# Patient Record
Sex: Female | Born: 1960 | Race: White | Hispanic: No | Marital: Married | Smoking: Never smoker
Health system: Southern US, Community
[De-identification: ages and names within clinical notes are randomized; demographics above are authoritative.]

## PROBLEM LIST (undated history)

## (undated) DIAGNOSIS — M810 Age-related osteoporosis without current pathological fracture: Secondary | ICD-10-CM

## (undated) DIAGNOSIS — G43909 Migraine, unspecified, not intractable, without status migrainosus: Secondary | ICD-10-CM

## (undated) DIAGNOSIS — D355 Benign neoplasm of carotid body: Secondary | ICD-10-CM

## (undated) HISTORY — PX: CAROTID BODY TUMOR EXCISION: SHX5156

## (undated) HISTORY — DX: Migraine, unspecified, not intractable, without status migrainosus: G43.909

## (undated) HISTORY — DX: Benign neoplasm of carotid body: D35.5

## (undated) HISTORY — DX: Age-related osteoporosis without current pathological fracture: M81.0

---

## 2007-05-07 ENCOUNTER — Ambulatory Visit: Payer: Self-pay | Admitting: Orthopedic Surgery

## 2010-08-19 ENCOUNTER — Ambulatory Visit: Payer: Self-pay | Admitting: Family Medicine

## 2013-10-06 ENCOUNTER — Ambulatory Visit: Payer: Self-pay | Admitting: Gastroenterology

## 2013-10-06 HISTORY — PX: COLONOSCOPY: SHX174

## 2014-09-14 ENCOUNTER — Encounter: Payer: Self-pay | Admitting: Family Medicine

## 2014-10-02 DIAGNOSIS — M810 Age-related osteoporosis without current pathological fracture: Secondary | ICD-10-CM | POA: Insufficient documentation

## 2014-10-02 DIAGNOSIS — G43909 Migraine, unspecified, not intractable, without status migrainosus: Secondary | ICD-10-CM | POA: Insufficient documentation

## 2014-10-02 DIAGNOSIS — D355 Benign neoplasm of carotid body: Secondary | ICD-10-CM | POA: Insufficient documentation

## 2014-10-14 ENCOUNTER — Encounter: Payer: Self-pay | Admitting: Family Medicine

## 2014-10-28 ENCOUNTER — Encounter: Payer: Self-pay | Admitting: Family Medicine

## 2014-11-12 ENCOUNTER — Encounter: Payer: Self-pay | Admitting: Family Medicine

## 2014-11-12 ENCOUNTER — Ambulatory Visit (INDEPENDENT_AMBULATORY_CARE_PROVIDER_SITE_OTHER): Payer: BC Managed Care – PPO | Admitting: Family Medicine

## 2014-11-12 VITALS — BP 113/71 | HR 75 | Temp 98.5°F | Ht 66.1 in | Wt 131.0 lb

## 2014-11-12 DIAGNOSIS — N889 Noninflammatory disorder of cervix uteri, unspecified: Secondary | ICD-10-CM

## 2014-11-12 DIAGNOSIS — M810 Age-related osteoporosis without current pathological fracture: Secondary | ICD-10-CM

## 2014-11-12 DIAGNOSIS — Z124 Encounter for screening for malignant neoplasm of cervix: Secondary | ICD-10-CM

## 2014-11-12 DIAGNOSIS — Z23 Encounter for immunization: Secondary | ICD-10-CM | POA: Diagnosis not present

## 2014-11-12 DIAGNOSIS — Z1239 Encounter for other screening for malignant neoplasm of breast: Secondary | ICD-10-CM

## 2014-11-12 DIAGNOSIS — Z0000 Encounter for general adult medical examination without abnormal findings: Secondary | ICD-10-CM

## 2014-11-12 NOTE — Assessment & Plan Note (Signed)
Pap smear today; request records from gyn; refer back for evaluation since I don't have any of her records and cervix still appears abnormal

## 2014-11-12 NOTE — Assessment & Plan Note (Addendum)
DEXA scan 2012, T-score -2.89 in hip; ordered another DEXA today; three servings of calcium a day; check vit D level; weight bearing activity encouraged

## 2014-11-12 NOTE — Progress Notes (Signed)
Patient ID: Caroline Harmon, female   DOB: [birth year], 54 y.o.   MRN: [id number]   Subjective:   Caroline Harmon is a 54 y.o. female here for a complete physical exam  Interim issues since last visit: no medical excitement  Grade A and B USPSTF recommendations Alcohol: not an issue Tobacco: no Depression screen PHQ 2/9 11/12/2014  Decreased Interest 0  Down, Depressed, Hopeless 0  PHQ - 2 Score 0  HTN: great control Obesity: n/a HIV, Hep B, Hep C: politley declined No abuse BRCA: n/a Cerv: last pap smear in July 2015; neg and HPV neg Colonoscopy: Sept 2015, next 10 years Mammogram; overdue Lung cancer: n/a Osteoporosis: no milk; does get green leafies; does eat tofu; nonsmoker AAA: n/a LMP 2 years ago Aspirin: not taking Diet: good eater; occasional red meat, mainly chicken and fish Exercise: walks regularly, could do better; weight-bearing activity Skin cancer: no tanning bed, time outdoors, uses suntan lotion  Past Medical History  Diagnosis Date  . Migraine   . Osteoporosis   . 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 Carotid body tumor excision      benign  . Colonoscopy  10/06/13   Family History  Problem Relation Age of Onset  . Migraines Mother   . Heart attack Father   . Cancer Father     skin  . Hypertension Father    Social History  Substance Use Topics  . Smoking status: Never Smoker   . Smokeless tobacco: Never Used  . Alcohol Use: Yes     Comment: occassional   Review of Systems  Constitutional: Negative for unexpected weight change.  HENT: Positive for nosebleeds (occasionally from dry air, allergies). Negative for dental problem and hearing loss.        Itching and pain in left ear  Eyes: Negative for visual disturbance.  Respiratory: Negative for shortness of breath and wheezing.   Cardiovascular: Negative for chest pain, palpitations and leg swelling.   Gastrointestinal: Negative for abdominal pain and blood in stool.  Endocrine: Negative for polydipsia and polyuria.  Genitourinary: Negative for hematuria, vaginal bleeding and vaginal discharge.  Musculoskeletal: Negative for joint swelling and arthralgias.       Some discomfort in right elbow if bent for 20 minutes, hurts to push on it; going on for a year  Skin:       No worrisome moles  Allergic/Immunologic: Positive for environmental allergies (OTC stuff).  Neurological: Negative for tremors.  Hematological: Negative for adenopathy. Does not bruise/bleed easily.  Psychiatric/Behavioral: Negative for dysphoric mood.    Objective:   Filed Vitals:   11/12/14 1505  BP: 113/71  Pulse: 75  Temp: 98.5 F (36.9 C)  Height: 5' 6.1" (1.679 m)  Weight: 131 lb (59.421 kg)  SpO2: 98%   Body mass index is 21.08 kg/(m^2). Wt Readings from Last 3 Encounters:  11/12/14 131 lb (59.421 kg)  09/10/13 137 lb (62.143 kg)   Physical Exam  Constitutional: She appears well-developed and well-nourished.  HENT:  Head: Normocephalic and atraumatic.  Eyes: Conjunctivae and EOM are normal. Right eye exhibits no hordeolum. Left eye exhibits no hordeolum. No scleral icterus.  Neck: Carotid bruit is not present. No thyromegaly present.  Cardiovascular: Normal rate, regular rhythm, S1 normal, S2 normal and normal heart sounds.   No extrasystoles are present.  Pulmonary/Chest: Effort normal and breath sounds normal. No respiratory distress. Right breast  exhibits no inverted nipple, no mass, no nipple discharge, no skin change and no tenderness. Left breast exhibits no inverted nipple, no mass, no nipple discharge, no skin change and no tenderness. Breasts are symmetrical.  Abdominal: Soft. Normal appearance and bowel sounds are normal. She exhibits no distension, no abdominal bruit, no pulsatile midline mass and no mass. There is no hepatosplenomegaly. There is no tenderness. No hernia.  Genitourinary:  Uterus normal. Pelvic exam was performed with patient prone. There is no rash or lesion on the right labia. There is no rash or lesion on the left labia. Cervix exhibits no motion tenderness. Right adnexum displays no mass, no tenderness and no fullness. Left adnexum displays no mass, no tenderness and no fullness.  Cervix shows either moderate transition zone or erythematous zone surrounding os  Musculoskeletal: Normal range of motion. She exhibits no edema.  Lymphadenopathy:       Head (right side): No submandibular adenopathy present.       Head (left side): No submandibular adenopathy present.    She has no cervical adenopathy.    She has no axillary adenopathy.  Neurological: She is alert. She displays no tremor. No cranial nerve deficit. She exhibits normal muscle tone. Gait normal.  Skin: Skin is warm and dry. No bruising and no ecchymosis noted. No cyanosis. No pallor.  Psychiatric: Her speech is normal and behavior is normal. Thought content normal. Her mood appears not anxious. She does not exhibit a depressed mood.    Assessment/Plan:   Problem List Items Addressed This Visit      Musculoskeletal and Integument   Osteoporosis    DEXA scan 2012, T-score -2.89 in hip; ordered another DEXA today; three servings of calcium a day; check vit D level; weight bearing activity encouraged      Relevant Orders   DG Bone Density   Vit D  25 hydroxy (rtn osteoporosis monitoring) (Completed)     Genitourinary   Abnormal appearance of cervix    Refer back to gyn      Relevant Orders   Ambulatory referral to Gynecology   Pap liquid-based and HPV (high risk) (Completed)     Other   Preventative health care - Primary    Healthy living encouraged; grade A and B recommendations USPSTF reviewed; see AVS      Relevant Orders   CBC with Differential/Platelet (Completed)   Comprehensive metabolic panel (Completed)   Lipid Panel w/o Chol/HDL Ratio (Completed)   Screening for cervical  cancer    Pap smear today; request records from gyn; refer back for evaluation since I don't have any of her records and cervix still appears abnormal      Relevant Orders   Pap liquid-based and HPV (high risk) (Completed)    Other Visit Diagnoses    Breast cancer screening        Relevant Orders    MM DIGITAL SCREENING BILATERAL    Immunization due        Relevant Orders    Tdap vaccine greater than or equal to 7yo IM (Completed)        No orders of the defined types were placed in this encounter.   Orders Placed This Encounter  Procedures  . MM DIGITAL SCREENING BILATERAL    Standing Status: Future     Number of Occurrences:      Standing Expiration Date: 01/12/2016    Order Specific Question:  Reason for Exam (SYMPTOM  OR DIAGNOSIS REQUIRED)    Answer:  screening mammogram    Order Specific Question:  Is the patient pregnant?    Answer:  No    Order Specific Question:  Preferred imaging location?    Answer:  [facility]  . DG Bone Density    Order Specific Question:  Reason for Exam (SYMPTOM  OR DIAGNOSIS REQUIRED)    Answer:  osteoporosis hip on DEXA 2012    Order Specific Question:  Preferred [facility] location?    [facility]    Order Specific Question:  Is the patient pregnant?    Answer:  No  . Tdap vaccine greater than or equal to 7yo IM  . CBC with Differential/Platelet  . Comprehensive metabolic panel    Order Specific Question:  Has the patient fasted?    Answer:  Yes  . Lipid Panel w/o Chol/HDL Ratio    Order Specific Question:  Has the patient fasted?    Answer:  Yes  . Vit D  25 hydroxy (rtn osteoporosis monitoring)  . Ambulatory referral to Gynecology    Referral Priority:  Routine    Referral Type:  Consultation    Referral Reason:  Specialty Services Required    Requested Specialty:  Gynecology    Number of Visits Requested:  1    Follow up plan: Return in about 1 year (around 11/12/2015) for physical.  An After Visit  Summary was printed and given to the patient.

## 2014-11-12 NOTE — Patient Instructions (Addendum)
GINA --> please get records from gynecologist including all pathology reports and office visits Your next colonoscopy will be due by August 2025 Please do schedule a screening mammogram Do try to get 3 servings of calcium a day Consider baby 81 mg coated aspirin daily for stroke prevention; stop immediately and seek attention for any abdominal pain, dark stools, etc. Use topical ice 15 minutes at a time 3x a day, use protective cloth between ice and skin Try turmeric as a natural anti-inflammatory (for pain and arthritis). It comes in capsules where you buy aspirin and fish oil, but also as a spice where you buy pepper and garlic powder. Consider something for phone use other than bending that elbow I put in the referral back to Dr. Marcelline Mates to check out your cervix, so my staff should call you about that appointment Return in 12+ months for your next physical  Health Maintenance Adopting a healthy lifestyle and getting preventive care can go a long way to promote health and wellness. Talk with your health care provider about what schedule of regular examinations is right for you. This is a good chance for you to check in with your provider about disease prevention and staying healthy. In between checkups, there are plenty of things you can do on your own. Experts have done a lot of research about which lifestyle changes and preventive measures are most likely to keep you healthy. Ask your health care provider for more information. WEIGHT AND DIET  Eat a healthy diet  Be sure to include plenty of vegetables, fruits, low-fat dairy products, and lean protein.  Do not eat a lot of foods high in solid fats, added sugars, or salt.  Get regular exercise. This is one of the most important things you can do for your health.  Most adults should exercise for at least 150 minutes each week. The exercise should increase your heart rate and make you sweat (moderate-intensity exercise).  Most adults should  also do strengthening exercises at least twice a week. This is in addition to the moderate-intensity exercise.  Maintain a healthy weight  Body mass index (BMI) is a measurement that can be used to identify possible weight problems. It estimates body fat based on height and weight. Your health care provider can help determine your BMI and help you achieve or maintain a healthy weight.  For females 37 years of age and older:   A BMI below 18.5 is considered underweight.  A BMI of 18.5 to 24.9 is normal.  A BMI of 25 to 29.9 is considered overweight.  A BMI of 30 and above is considered obese.  Watch levels of cholesterol and blood lipids  You should start having your blood tested for lipids and cholesterol at 56 years of age, then have this test every 5 years.  You may need to have your cholesterol levels checked more often if:  Your lipid or cholesterol levels are high.  You are older than 56 years of age.  You are at high risk for heart disease.  CANCER SCREENING   Lung Cancer  Lung cancer screening is recommended for adults 79-70 years old who are at high risk for lung cancer because of a history of smoking.  A yearly low-dose CT scan of the lungs is recommended for people who:  Currently smoke.  Have quit within the past 15 years.  Have at least a 30-pack-year history of smoking. A pack year is smoking an average of one pack of  cigarettes a day for 1 year.  Yearly screening should continue until it has been 15 years since you quit.  Yearly screening should stop if you develop a health problem that would prevent you from having lung cancer treatment.  Breast Cancer  Practice breast self-awareness. This means understanding how your breasts normally appear and feel.  It also means doing regular breast self-exams. Let your health care provider know about any changes, no matter how small.  If you are in your 20s or 30s, you should have a clinical breast exam (CBE)  by a health care provider every 1-3 years as part of a regular health exam.  If you are 25 or older, have a CBE every year. Also consider having a breast X-ray (mammogram) every year.  If you have a family history of breast cancer, talk to your health care provider about genetic screening.  If you are at high risk for breast cancer, talk to your health care provider about having an MRI and a mammogram every year.  Breast cancer gene (BRCA) assessment is recommended for women who have family members with BRCA-related cancers. BRCA-related cancers include:  Breast.  Ovarian.  Tubal.  Peritoneal cancers.  Results of the assessment will determine the need for genetic counseling and BRCA1 and BRCA2 testing. Cervical Cancer Routine pelvic examinations to screen for cervical cancer are no longer recommended for nonpregnant women who are considered low risk for cancer of the pelvic organs (ovaries, uterus, and vagina) and who do not have symptoms. A pelvic examination may be necessary if you have symptoms including those associated with pelvic infections. Ask your health care provider if a screening pelvic exam is right for you.   The Pap test is the screening test for cervical cancer for women who are considered at risk.  If you had a hysterectomy for a problem that was not cancer or a condition that could lead to cancer, then you no longer need Pap tests.  If you are older than 65 years, and you have had normal Pap tests for the past 10 years, you no longer need to have Pap tests.  If you have had past treatment for cervical cancer or a condition that could lead to cancer, you need Pap tests and screening for cancer for at least 20 years after your treatment.  If you no longer get a Pap test, assess your risk factors if they change (such as having a new sexual partner). This can affect whether you should start being screened again.  Some women have medical problems that increase their  chance of getting cervical cancer. If this is the case for you, your health care provider may recommend more frequent screening and Pap tests.  The human papillomavirus (HPV) test is another test that may be used for cervical cancer screening. The HPV test looks for the virus that can cause cell changes in the cervix. The cells collected during the Pap test can be tested for HPV.  The HPV test can be used to screen women 43 years of age and older. Getting tested for HPV can extend the interval between normal Pap tests from three to five years.  An HPV test also should be used to screen women of any age who have unclear Pap test results.  After 56 years of age, women should have HPV testing as often as Pap tests.  Colorectal Cancer  This type of cancer can be detected and often prevented.  Routine colorectal cancer screening usually begins  at 56 years of age and continues through 56 years of age.  Your health care provider may recommend screening at an earlier age if you have risk factors for colon cancer.  Your health care provider may also recommend using home test kits to check for hidden blood in the stool.  A small camera at the end of a tube can be used to examine your colon directly (sigmoidoscopy or colonoscopy). This is done to check for the earliest forms of colorectal cancer.  Routine screening usually begins at age 28.  Direct examination of the colon should be repeated every 5-10 years through 56 years of age. However, you may need to be screened more often if early forms of precancerous polyps or small growths are found. Skin Cancer  Check your skin from head to toe regularly.  Tell your health care provider about any new moles or changes in moles, especially if there is a change in a mole's shape or color.  Also tell your health care provider if you have a mole that is larger than the size of a pencil eraser.  Always use sunscreen. Apply sunscreen liberally and  repeatedly throughout the day.  Protect yourself by wearing long sleeves, pants, a wide-brimmed hat, and sunglasses whenever you are outside. HEART DISEASE, DIABETES, AND HIGH BLOOD PRESSURE   Have your blood pressure checked at least every 1-2 years. High blood pressure causes heart disease and increases the risk of stroke.  If you are between 22 years and 50 years old, ask your health care provider if you should take aspirin to prevent strokes.  Have regular diabetes screenings. This involves taking a blood sample to check your fasting blood sugar level.  If you are at a normal weight and have a low risk for diabetes, have this test once every three years after 56 years of age.  If you are overweight and have a high risk for diabetes, consider being tested at a younger age or more often. PREVENTING INFECTION  Hepatitis B  If you have a higher risk for hepatitis B, you should be screened for this virus. You are considered at high risk for hepatitis B if:  You were born in a country where hepatitis B is common. Ask your health care provider which countries are considered high risk.  Your parents were born in a high-risk country, and you have not been immunized against hepatitis B (hepatitis B vaccine).  You have HIV or AIDS.  You use needles to inject street drugs.  You live with someone who has hepatitis B.  You have had sex with someone who has hepatitis B.  You get hemodialysis treatment.  You take certain medicines for conditions, including cancer, organ transplantation, and autoimmune conditions. Hepatitis C  Blood testing is recommended for:  Everyone born from [birth year] through 1967.  Anyone with known risk factors for hepatitis C. Sexually transmitted infections (STIs)  You should be screened for sexually transmitted infections (STIs) including gonorrhea and chlamydia if:  You are sexually active and are younger than 56 years of age.  You are older than 56 years of  age and your health care provider tells you that you are at risk for this type of infection.  Your sexual activity has changed since you were last screened and you are at an increased risk for chlamydia or gonorrhea. Ask your health care provider if you are at risk.  If you do not have HIV, but are at risk, it may be recommended  that you take a prescription medicine daily to prevent HIV infection. This is called pre-exposure prophylaxis (PrEP). You are considered at risk if:  You are sexually active and do not regularly use condoms or know the HIV status of your partner(s).  You take drugs by injection.  You are sexually active with a partner who has HIV. Talk with your health care provider about whether you are at high risk of being infected with HIV. If you choose to begin PrEP, you should first be tested for HIV. You should then be tested every 3 months for as long as you are taking PrEP.  PREGNANCY   If you are premenopausal and you may become pregnant, ask your health care provider about preconception counseling.  If you may become pregnant, take 400 to 800 micrograms (mcg) of folic acid every day.  If you want to prevent pregnancy, talk to your health care provider about birth control (contraception). OSTEOPOROSIS AND MENOPAUSE   Osteoporosis is a disease in which the bones lose minerals and strength with aging. This can result in serious bone fractures. Your risk for osteoporosis can be identified using a bone density scan.  If you are 100 years of age or older, or if you are at risk for osteoporosis and fractures, ask your health care provider if you should be screened.  Ask your health care provider whether you should take a calcium or vitamin D supplement to lower your risk for osteoporosis.  Menopause may have certain physical symptoms and risks.  Hormone replacement therapy may reduce some of these symptoms and risks. Talk to your health care provider about whether hormone  replacement therapy is right for you.  HOME CARE INSTRUCTIONS   Schedule regular health, dental, and eye exams.  Stay current with your immunizations.   Do not use any tobacco products including cigarettes, chewing tobacco, or electronic cigarettes.  If you are pregnant, do not drink alcohol.  If you are breastfeeding, limit how much and how often you drink alcohol.  Limit alcohol intake to no more than 1 drink per day for nonpregnant women. One drink equals 12 ounces of beer, 5 ounces of wine, or 1 ounces of hard liquor.  Do not use street drugs.  Do not share needles.  Ask your health care provider for help if you need support or information about quitting drugs.  Tell your health care provider if you often feel depressed.  Tell your health care provider if you have ever been abused or do not feel safe at home. Document Released: 08/29/2010 Document Revised: 06/30/2013 Document Reviewed: 01/15/2013 [facility] Patient Information 2015 [place], [place]. This information is not intended to replace advice given to you by your health care provider. Make sure you discuss any questions you have with your health care provider. Osteoporosis Throughout your life, your body breaks down old bone and replaces it with new bone. As you get older, your body does not replace bone as quickly as it breaks it down. By the age of 62 years, most people begin to gradually lose bone because of the imbalance between bone loss and replacement. Some people lose more bone than others. Bone loss beyond a specified normal degree is considered osteoporosis.  Osteoporosis affects the strength and durability of your bones. The inside of the ends of your bones and your flat bones, like the bones of your pelvis, look like honeycomb, filled with tiny open spaces. As bone loss occurs, your bones become less dense. This means that the  open spaces inside your bones become bigger and the walls between these spaces become  thinner. This makes your bones weaker. Bones of a person with osteoporosis can become so weak that they can break (fracture) during minor accidents, such as a simple fall. CAUSES  The following factors have been associated with the development of osteoporosis:  Smoking.  Drinking more than 2 alcoholic drinks several days per week.  Long-term use of certain medicines:  Corticosteroids.  Chemotherapy medicines.  Thyroid medicines.  Antiepileptic medicines.  Gonadal hormone suppression medicine.  Immunosuppression medicine.  Being underweight.  Lack of physical activity.  Lack of exposure to the sun. This can lead to vitamin D deficiency.  Certain medical conditions:  Certain inflammatory bowel diseases, such as Crohn disease and ulcerative colitis.  Diabetes.  Hyperthyroidism.  Hyperparathyroidism. RISK FACTORS Anyone can develop osteoporosis. However, the following factors can increase your risk of developing osteoporosis:  Gender--Women are at higher risk than men.  Age--Being older than 50 years increases your risk.  Ethnicity--White and Asian people have an increased risk.  Weight --Being extremely underweight can increase your risk of osteoporosis.  Family history of osteoporosis--Having a family member who has developed osteoporosis can increase your risk. SYMPTOMS  Usually, people with osteoporosis have no symptoms.  DIAGNOSIS  Signs during a physical exam that may prompt your caregiver to suspect osteoporosis include:  Decreased height. This is usually caused by the compression of the bones that form your spine (vertebrae) because they have weakened and become fractured.  A curving or rounding of the upper back (kyphosis). To confirm signs of osteoporosis, your caregiver may request a procedure that uses 2 low-dose X-ray beams with different levels of energy to measure your bone mineral density (dual-energy X-ray absorptiometry [DXA]). Also, your  caregiver may check your level of vitamin D. TREATMENT  The goal of osteoporosis treatment is to strengthen bones in order to decrease the risk of bone fractures. There are different types of medicines available to help achieve this goal. Some of these medicines work by slowing the processes of bone loss. Some medicines work by increasing bone density. Treatment also involves making sure that your levels of calcium and vitamin D are adequate. PREVENTION  There are things you can do to help prevent osteoporosis. Adequate intake of calcium and vitamin D can help you achieve optimal bone mineral density. Regular exercise can also help, especially resistance and weight-bearing activities. If you smoke, quitting smoking is an important part of osteoporosis prevention. MAKE SURE YOU:  Understand these instructions.  Will watch your condition.  Will get help right away if you are not doing well or get worse. FOR MORE INFORMATION www.osteo.org and [employer].com.ee Document Released: 11/23/2004 Document Revised: 06/10/2012 Document Reviewed: 01/28/2011 [facility] Patient Information 2015 [place], [place]. This information is not intended to replace advice given to you by your health care provider. Make sure you discuss any questions you have with your health care provider.

## 2014-11-13 LAB — LIPID PANEL W/O CHOL/HDL RATIO
Cholesterol, Total: 175 mg/dL (ref 100–199)
HDL: 100 mg/dL (ref >39)
LDL CALC: 65 mg/dL (ref 0–99)
Triglycerides: 48 mg/dL (ref 0–149)
VLDL CHOLESTEROL CAL: 10 mg/dL (ref 5–40)

## 2014-11-13 LAB — COMPREHENSIVE METABOLIC PANEL
ALK PHOS: 80 IU/L (ref 39–117)
ALT: 13 IU/L (ref 0–32)
AST: 22 IU/L (ref 0–40)
Albumin/Globulin Ratio: 1.9 (ref 1.1–2.5)
Albumin: 4.6 g/dL (ref 3.5–5.5)
BUN/Creatinine Ratio: 17 (ref 9–23)
BUN: 14 mg/dL (ref 6–24)
Bilirubin Total: 0.6 mg/dL (ref 0.0–1.2)
CO2: 27 mmol/L (ref 18–29)
Calcium: 9.8 mg/dL (ref 8.7–10.2)
Chloride: 100 mmol/L (ref 97–108)
Creatinine, Ser: 0.81 mg/dL (ref 0.57–1.00)
GFR calc non Af Amer: 81 mL/min/{1.73_m2} (ref >59)
GFR, EST AFRICAN AMERICAN: 94 mL/min/{1.73_m2} (ref >59)
GLOBULIN, TOTAL: 2.4 g/dL (ref 1.5–4.5)
GLUCOSE: 88 mg/dL (ref 65–99)
POTASSIUM: 5.1 mmol/L (ref 3.5–5.2)
Sodium: 141 mmol/L (ref 134–144)
Total Protein: 7 g/dL (ref 6.0–8.5)

## 2014-11-13 LAB — CBC WITH DIFFERENTIAL/PLATELET
BASOS ABS: 0.1 10*3/uL (ref 0.0–0.2)
Basos: 1 %
EOS (ABSOLUTE): 0.2 10*3/uL (ref 0.0–0.4)
Eos: 3 %
Hematocrit: 39.4 % (ref 34.0–46.6)
Hemoglobin: 13.1 g/dL (ref 11.1–15.9)
Immature Grans (Abs): 0 10*3/uL (ref 0.0–0.1)
Immature Granulocytes: 0 %
LYMPHS ABS: 2.3 10*3/uL (ref 0.7–3.1)
Lymphs: 37 %
MCH: 30.5 pg (ref 26.6–33.0)
MCHC: 33.2 g/dL (ref 31.5–35.7)
MCV: 92 fL (ref 79–97)
MONOCYTES: 7 %
MONOS ABS: 0.4 10*3/uL (ref 0.1–0.9)
NEUTROS ABS: 3.3 10*3/uL (ref 1.4–7.0)
Neutrophils: 52 %
Platelets: 329 10*3/uL (ref 150–379)
RBC: 4.29 x10E6/uL (ref 3.77–5.28)
RDW: 13.3 % (ref 12.3–15.4)
WBC: 6.2 10*3/uL (ref 3.4–10.8)

## 2014-11-13 LAB — VITAMIN D 25 HYDROXY (VIT D DEFICIENCY, FRACTURES): Vit D, 25-Hydroxy: 40.4 ng/mL (ref 30.0–100.0)

## 2014-11-16 ENCOUNTER — Encounter: Payer: Self-pay | Admitting: Family Medicine

## 2014-11-17 LAB — PAP LB AND HPV HIGH-RISK: PAP Smear Comment: 0

## 2014-11-18 DIAGNOSIS — N889 Noninflammatory disorder of cervix uteri, unspecified: Secondary | ICD-10-CM | POA: Insufficient documentation

## 2014-11-18 NOTE — Assessment & Plan Note (Signed)
Refer back to gyn.   

## 2014-11-18 NOTE — Assessment & Plan Note (Signed)
Healthy living encouraged; grade A and B recommendations USPSTF reviewed; see AVS

## 2014-12-01 ENCOUNTER — Ambulatory Visit: Payer: BC Managed Care – PPO | Admitting: Obstetrics and Gynecology

## 2014-12-02 ENCOUNTER — Other Ambulatory Visit: Payer: Self-pay | Admitting: Obstetrics and Gynecology

## 2014-12-02 ENCOUNTER — Ambulatory Visit (INDEPENDENT_AMBULATORY_CARE_PROVIDER_SITE_OTHER): Payer: BC Managed Care – PPO | Admitting: Obstetrics and Gynecology

## 2014-12-02 ENCOUNTER — Encounter: Payer: Self-pay | Admitting: Obstetrics and Gynecology

## 2014-12-02 VITALS — BP 122/78 | HR 73 | Ht 67.0 in | Wt 132.2 lb

## 2014-12-02 DIAGNOSIS — N889 Noninflammatory disorder of cervix uteri, unspecified: Secondary | ICD-10-CM

## 2014-12-02 NOTE — Progress Notes (Signed)
GYNECOLOGY PROGRESS NOTE  Subjective:    Patient ID: Caroline Harmon, female    DOB: [birth year], 54 y.o.   MRN: [id number]  HPI  Patient is a 54 y.o. [id number] female who presents as a referral from Dr. Enid Derry ([place]) for abnormal appearing cervix, noted on routine annual exam. Patient denies symptoms of PMB or vaginal pain. Last pap smear 10/2014, normal.  Was seen by GYN last year for similar problem, cervical biopsy at that time benign (noted acute cervicitis but NuSwab negative for pathogens).   The following portions of the patient's history were reviewed and updated as appropriate: allergies, current medications, past family history, past medical history, past social history, past surgical history and problem list.  Review of Systems A comprehensive review of systems was negative.   Objective:   Blood pressure 122/78, pulse 73, height 5\' 7"  (1.702 m), weight 132 lb 3 oz (59.96 kg). General appearance: alert and no distress Abdomen: soft, non-tender; bowel sounds normal; no masses,  no organomegaly Pelvic: external genitalia normal, vagina normal without discharge, no cervical motion tenderness, cervix with areas of areas of hypertrophic tissue at os (possibly glandular), with abnormal vessel noted at 12 o'clock.  Rectovaginal septum normal and Cervix  Extremities: extremities normal, atraumatic, no cyanosis or edema Neurologic: Grossly normal   Assessment:   Cervical lesion at 12 o'clock  Plan:   1. Biopsy of cervix taken at 12 o'clock.  Monsel's applied to biopsy site. ECC performed.  2. Will notify patient of pathology by phone in 1 week.    [name], MD [facility]

## 2014-12-07 LAB — PATHOLOGY

## 2014-12-11 ENCOUNTER — Other Ambulatory Visit: Payer: Self-pay | Admitting: Obstetrics and Gynecology

## 2014-12-11 MED ORDER — DOXYCYCLINE HYCLATE 100 MG PO CAPS: 100.0000 mg | ORAL_CAPSULE | Freq: Two times a day (BID) | ORAL | Status: DC

## 2014-12-15 ENCOUNTER — Other Ambulatory Visit: Payer: Self-pay | Admitting: Obstetrics and Gynecology

## 2014-12-21 ENCOUNTER — Telehealth: Payer: Self-pay

## 2014-12-21 NOTE — Telephone Encounter (Signed)
Patients husband notified that she is due for Mammogram and DEXA and he will remind her to call them.

## 2014-12-21 NOTE — Telephone Encounter (Signed)
-----   Message from Arnetha Courser, MD sent at 12/18/2014  4:39 PM EDT ----- Regarding: Overdue DEXA   ----- Message -----    From: SYSTEM    Sent: 11/13/2014  12:06 AM      To: Arnetha Courser, MD

## 2015-04-18 ENCOUNTER — Telehealth: Payer: Self-pay | Admitting: Family Medicine

## 2015-04-18 NOTE — Telephone Encounter (Signed)
There is an outstanding order for a DEXA scan from September; please ask pt to schedule this at her earliest convenience Thank you

## 2015-04-19 NOTE — Telephone Encounter (Signed)
Called and spoke to patient's husband, he is on HIPPA according to [place]. I let him know about the DEXA scan and he stated he would tell the patient tonight. He stated it would probably be the summer when she got the scan because of her job. Patient's husband also wanted to know when his appointment was so I looked it up and told him.

## 2015-05-04 ENCOUNTER — Other Ambulatory Visit: Payer: Self-pay | Admitting: Family Medicine

## 2015-05-04 NOTE — Telephone Encounter (Signed)
Pt would like a refill on relpax.

## 2015-05-05 MED ORDER — ELETRIPTAN HYDROBROMIDE 40 MG PO TABS: 40.0000 mg | ORAL_TABLET | ORAL | Status: DC | PRN

## 2015-05-05 NOTE — Telephone Encounter (Signed)
Routing to provider.  Last seen 09/10/13.

## 2015-05-05 NOTE — Telephone Encounter (Signed)
I actually saw her in Sept 2016; Rx approved

## 2015-07-06 ENCOUNTER — Other Ambulatory Visit: Payer: Self-pay | Admitting: Family Medicine

## 2015-07-06 NOTE — Telephone Encounter (Signed)
Last BP reviewed

## 2015-09-17 ENCOUNTER — Other Ambulatory Visit: Payer: Self-pay | Admitting: Family Medicine

## 2015-11-15 ENCOUNTER — Encounter: Payer: BC Managed Care – PPO | Admitting: Family Medicine

## 2015-12-01 ENCOUNTER — Telehealth: Payer: Self-pay | Admitting: Family Medicine

## 2015-12-01 NOTE — Telephone Encounter (Signed)
Patient has annual physical for 12/07/15 but would like to know if you could print out a lab order. She would like to do her lab work Monday morning prior to her appointment. She is not able to go all day without eating.

## 2015-12-06 ENCOUNTER — Other Ambulatory Visit: Payer: Self-pay | Admitting: Family Medicine

## 2015-12-06 NOTE — Telephone Encounter (Signed)
Last BP reviewed; Rx approved 

## 2015-12-06 NOTE — Telephone Encounter (Signed)
Spoke with Roselyn Reef and Dr Sanda Klein about this and they advised patient to eat a big breakfast and skip lunch. That Dr Sanda Klein does not like to order labs before appointment. Her husband then stated that she develops really bad migraines and is not able to skip a meal. I then repeated what Roselyn Reef and Dr Sanda Klein said and I also suggested that she eat her meals tomorrow and that she can come back on Wednesday morning before going to work and do her labs then. He verbalized understanding.

## 2015-12-07 ENCOUNTER — Encounter: Payer: Self-pay | Admitting: Family Medicine

## 2015-12-07 ENCOUNTER — Ambulatory Visit (INDEPENDENT_AMBULATORY_CARE_PROVIDER_SITE_OTHER): Payer: BC Managed Care – PPO | Admitting: Family Medicine

## 2015-12-07 VITALS — BP 126/72 | HR 82 | Temp 97.8°F | Resp 14 | Ht 66.0 in | Wt 130.9 lb

## 2015-12-07 DIAGNOSIS — M816 Localized osteoporosis [Lequesne]: Secondary | ICD-10-CM | POA: Diagnosis not present

## 2015-12-07 DIAGNOSIS — Z1159 Encounter for screening for other viral diseases: Secondary | ICD-10-CM

## 2015-12-07 DIAGNOSIS — N889 Noninflammatory disorder of cervix uteri, unspecified: Secondary | ICD-10-CM

## 2015-12-07 DIAGNOSIS — Z0000 Encounter for general adult medical examination without abnormal findings: Secondary | ICD-10-CM

## 2015-12-07 LAB — COMPLETE METABOLIC PANEL WITH GFR
ALK PHOS: 69 U/L (ref 33–130)
ALT: 11 U/L (ref 6–29)
AST: 20 U/L (ref 10–35)
Albumin: 4.6 g/dL (ref 3.6–5.1)
BILIRUBIN TOTAL: 0.8 mg/dL (ref 0.2–1.2)
BUN: 14 mg/dL (ref 7–25)
CALCIUM: 9.7 mg/dL (ref 8.6–10.4)
CO2: 29 mmol/L (ref 20–31)
CREATININE: 0.67 mg/dL (ref 0.50–1.05)
Chloride: 101 mmol/L (ref 98–110)
GFR, Est Non African American: >89 mL/min (ref >=60)
Glucose, Bld: 84 mg/dL (ref 65–99)
Potassium: 3.9 mmol/L (ref 3.5–5.3)
Sodium: 139 mmol/L (ref 135–146)
TOTAL PROTEIN: 7.3 g/dL (ref 6.1–8.1)

## 2015-12-07 LAB — CBC WITH DIFFERENTIAL/PLATELET
BASOS PCT: 1 %
Basophils Absolute: 66 cells/uL (ref 0–200)
Eosinophils Absolute: 198 cells/uL (ref 15–500)
Eosinophils Relative: 3 %
HEMATOCRIT: 39.7 % (ref 35.0–45.0)
Hemoglobin: 13.2 g/dL (ref 11.7–15.5)
LYMPHS PCT: 34 %
Lymphs Abs: 2244 cells/uL (ref 850–3900)
MCH: 30.4 pg (ref 27.0–33.0)
MCHC: 33.2 g/dL (ref 32.0–36.0)
MCV: 91.5 fL (ref 80.0–100.0)
MONO ABS: 396 {cells}/uL (ref 200–950)
MPV: 8.9 fL (ref 7.5–12.5)
Monocytes Relative: 6 %
NEUTROS ABS: 3696 {cells}/uL (ref 1500–7800)
Neutrophils Relative %: 56 %
PLATELETS: 288 10*3/uL (ref 140–400)
RBC: 4.34 MIL/uL (ref 3.80–5.10)
RDW: 13 % (ref 11.0–15.0)
WBC: 6.6 10*3/uL (ref 3.8–10.8)

## 2015-12-07 LAB — TSH: TSH: 2.67 mIU/L

## 2015-12-07 LAB — LIPID PANEL
CHOLESTEROL: 184 mg/dL (ref 125–200)
HDL: 93 mg/dL (ref >=46)
LDL Cholesterol: 82 mg/dL (ref <130)
TRIGLYCERIDES: 47 mg/dL (ref <150)
Total CHOL/HDL Ratio: 2 Ratio (ref <=5.0)
VLDL: 9 mg/dL (ref <30)

## 2015-12-07 NOTE — Assessment & Plan Note (Signed)
Check pap with HPV

## 2015-12-07 NOTE — Progress Notes (Signed)
Patient ID: Caroline Harmon, female   DOB: 1960/12/13, 55 y.o.   MRN: [id number]   Subjective:   Caroline Harmon is a 55 y.o. female here for a complete physical exam  Interim issues since last visit: had seen GYN, Dr. Marcelline Mates, for abnormal appearing cervix; had biopsy  USPSTF grade A and B recommendations Alcohol: one glass at dinner 3 nights a week Depression:  Depression screen [facility] 2/9 12/07/2015 11/12/2014  Decreased Interest 0 0  Down, Depressed, Hopeless 0 0  PHQ - 2 Score 0 0   Hypertension: controlled Obesity: no Tobacco use: never smoker  HIV, hep B, hep C: hep C today, nothing else STD testing and prevention (chl/gon/syphilis): no interest Lipids: today Glucose: today, 6 am egg white on english muffin Colorectal cancer: no fam hx of colon cancer, next 2025 Breast cancer: mammo next week; no lumps BRCA gene screening: no breast or ovarian cancer in the family Intimate partner violence: no Cervical cancer screening: today; last  Lung cancer: n/a Osteoporosis: had DEXA 5-6 years ago; hip was a little thin, osteopenia or osteoporosis Fall prevention/vitamin D: discussed Aspirin: discussed Diet: pretty good eater, salads, grilled chicken Exercise: walks Skin cancer: no worrisome moles  Past Medical History:  Diagnosis Date  . 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 Migraine   . Osteoporosis    Past Surgical History:  Procedure Laterality Date  . CAROTID BODY TUMOR EXCISION     benign  . COLONOSCOPY  10/06/13   Family History  Problem Relation Age of Onset  . Migraines Mother   . Heart attack Father   . Cancer Father     skin  . Hypertension Father    Social History  Substance Use Topics  . Smoking status: Never Smoker  . Smokeless tobacco: Never Used  . Alcohol use Yes     Comment: occassional   Review of Systems  Constitutional: Negative for unexpected weight change.  HENT: Negative for hearing loss.   Eyes: Negative for  visual disturbance.  Respiratory: Negative for wheezing.   Cardiovascular: Negative for chest pain.  Gastrointestinal: Negative for blood in stool.  Endocrine: Negative for polydipsia and polyuria.  Genitourinary: Negative for hematuria.  Musculoskeletal: Positive for arthralgias (pain both elbows).  Skin: Negative for rash.  Allergic/Immunologic: Negative for food allergies.  Neurological: Negative for tremors.  Hematological: Negative for adenopathy. Does not bruise/bleed easily.  Psychiatric/Behavioral: Negative for dysphoric mood.    Objective:   Vitals:   12/07/15 1618  BP: 126/72  Pulse: 82  Resp: 14  Temp: 97.8 F (36.6 C)  TempSrc: Oral  SpO2: 99%  Weight: 130 lb 14.4 oz (59.4 kg)  Height: 5' 6"  (1.676 m)   Body mass index is 21.13 kg/m. Wt Readings from Last 3 Encounters:  12/07/15 130 lb 14.4 oz (59.4 kg)  12/02/14 132 lb 3 oz (60 kg)  11/12/14 131 lb (59.4 kg)   Physical Exam  Constitutional: She appears well-developed and well-nourished.  HENT:  Head: Normocephalic and atraumatic.  Eyes: Conjunctivae and EOM are normal. Right eye exhibits no hordeolum. Left eye exhibits no hordeolum. No scleral icterus.  Neck: Carotid bruit is not present. No thyromegaly present.  Cardiovascular: Normal rate, regular rhythm, S1 normal, S2 normal and normal heart sounds.   No extrasystoles are present.  Pulmonary/Chest: Effort normal and breath sounds normal. No respiratory distress. Right breast exhibits no inverted nipple, no mass, no nipple discharge, no skin change and no tenderness.  Left breast exhibits no inverted nipple, no mass, no nipple discharge, no skin change and no tenderness. Breasts are symmetrical.  Abdominal: Soft. Normal appearance and bowel sounds are normal. She exhibits no distension, no abdominal bruit, no pulsatile midline mass and no mass. There is no hepatosplenomegaly. There is no tenderness. No hernia.  Genitourinary: Uterus normal. Pelvic exam was  performed with patient prone. There is no rash or lesion on the right labia. There is no rash or lesion on the left labia. Cervix exhibits friability. Cervix exhibits no motion tenderness and no discharge. Right adnexum displays no mass, no tenderness and no fullness. Left adnexum displays no mass, no tenderness and no fullness. No erythema or bleeding in the vagina. No vaginal discharge found.    Musculoskeletal: Normal range of motion. She exhibits no edema.  Lymphadenopathy:       Head (right side): No submandibular adenopathy present.       Head (left side): No submandibular adenopathy present.    She has no cervical adenopathy.    She has no axillary adenopathy.  Neurological: She is alert. She displays no tremor. No cranial nerve deficit. She exhibits normal muscle tone. Gait normal.  Skin: Skin is warm and dry. No bruising and no ecchymosis noted. No cyanosis. No pallor.  Psychiatric: Her speech is normal and behavior is normal. Thought content normal. Her mood appears not anxious. She does not exhibit a depressed mood.    Assessment/Plan:   Problem List Items Addressed This Visit      Musculoskeletal and Integument   Osteoporosis    Check dexa, discussed bone density, weight-bearing activity, three servings of calcium daily, etc      Relevant Orders   DG Bone Density     Genitourinary   Abnormal appearance of cervix    Check pap with HPV      Relevant Orders   PAP,TP IMGw/HPV RNA,rflx [id number],58/30     Other   Preventative health care    USPSTF grade A and B recommendations reviewed with patient; age-appropriate recommendations, preventive care, screening tests, etc discussed and encouraged; healthy living encouraged; see AVS for patient education given to patient       Other Visit Diagnoses    Annual physical exam    -  Primary   Relevant Orders   CBC with Differential/Platelet   TSH   Lipid panel   COMPLETE METABOLIC PANEL WITH GFR   Need for hepatitis C  screening test       check hep C antibody; discussed reasoning behind screening recommendation   Relevant Orders   Hepatitis C Antibody       No orders of the defined types were placed in this encounter.  Orders Placed This Encounter  Procedures  . DG Bone Density    Order Specific Question:   Reason for Exam (SYMPTOM  OR DIAGNOSIS REQUIRED)    Answer:   hx of osteoporosis    Order Specific Question:   Preferred imaging location?    Answer:   [facility]    Order Specific Question:   Is the patient pregnant?    Answer:   No  . CBC with Differential/Platelet  . TSH  . Lipid panel  . COMPLETE METABOLIC PANEL WITH GFR  . Hepatitis C Antibody    Follow up plan: Return in about 1 year (around 12/06/2016) for complete physical.  An After Visit Summary was printed and given to the patient.

## 2015-12-07 NOTE — Assessment & Plan Note (Addendum)
Check dexa, discussed bone density, weight-bearing activity, three servings of calcium daily, etc

## 2015-12-07 NOTE — Patient Instructions (Addendum)
We'll contact you about your labs Please do call to schedule your bone density study; the number to schedule one at either [facility] or [facility] is [phone] Recommend weight-bearing activity (anything except swimming and recumbent biking counts) Do try to get three servings of calcium daily Please do contact Dr. Marcelline Mates in one week if you have not heard back from her [phone]  Health Maintenance, Female Adopting a healthy lifestyle and getting preventive care can go a long way to promote health and wellness. Talk with your health care provider about what schedule of regular examinations is right for you. This is a good chance for you to check in with your provider about disease prevention and staying healthy. In between checkups, there are plenty of things you can do on your own. Experts have done a lot of research about which lifestyle changes and preventive measures are most likely to keep you healthy. Ask your health care provider for more information. WEIGHT AND DIET  Eat a healthy diet  Be sure to include plenty of vegetables, fruits, low-fat dairy products, and lean protein.  Do not eat a lot of foods high in solid fats, added sugars, or salt.  Get regular exercise. This is one of the most important things you can do for your health.  Most adults should exercise for at least 150 minutes each week. The exercise should increase your heart rate and make you sweat (moderate-intensity exercise).  Most adults should also do strengthening exercises at least twice a week. This is in addition to the moderate-intensity exercise.  Maintain a healthy weight  Body mass index (BMI) is a measurement that can be used to identify possible weight problems. It estimates body fat based on height and weight. Your health care provider can help determine your BMI and help you achieve or maintain a healthy weight.  For females 36 years of age and older:   A BMI  below 18.5 is considered underweight.  A BMI of 18.5 to 24.9 is normal.  A BMI of 25 to 29.9 is considered overweight.  A BMI of 30 and above is considered obese.  Watch levels of cholesterol and blood lipids  You should start having your blood tested for lipids and cholesterol at 57 years of age, then have this test every 5 years.  You may need to have your cholesterol levels checked more often if:  Your lipid or cholesterol levels are high.  You are older than 57 years of age.  You are at high risk for heart disease.  CANCER SCREENING   Lung Cancer  Lung cancer screening is recommended for adults 52-12 years old who are at high risk for lung cancer because of a history of smoking.  A yearly low-dose CT scan of the lungs is recommended for people who:  Currently smoke.  Have quit within the past 15 years.  Have at least a 30-pack-year history of smoking. A pack year is smoking an average of one pack of cigarettes a day for 1 year.  Yearly screening should continue until it has been 15 years since you quit.  Yearly screening should stop if you develop a health problem that would prevent you from having lung cancer treatment.  Breast Cancer  Practice breast self-awareness. This means understanding how your breasts normally appear and feel.  It also means doing regular breast self-exams. Let your health care provider know about any changes, no matter how small.  If you are in your  55s or 21s, you should have a clinical breast exam (CBE) by a health care provider every 1-3 years as part of a regular health exam.  If you are 7 or older, have a CBE every year. Also consider having a breast X-ray (mammogram) every year.  If you have a family history of breast cancer, talk to your health care provider about genetic screening.  If you are at high risk for breast cancer, talk to your health care provider about having an MRI and a mammogram every year.  Breast cancer gene  (BRCA) assessment is recommended for women who have family members with BRCA-related cancers. BRCA-related cancers include:  Breast.  Ovarian.  Tubal.  Peritoneal cancers.  Results of the assessment will determine the need for genetic counseling and BRCA1 and BRCA2 testing. Cervical Cancer Your health care provider may recommend that you be screened regularly for cancer of the pelvic organs (ovaries, uterus, and vagina). This screening involves a pelvic examination, including checking for microscopic changes to the surface of your cervix (Pap test). You may be encouraged to have this screening done every 3 years, beginning at age 23.  For women ages 73-65, health care providers may recommend pelvic exams and Pap testing every 3 years, or they may recommend the Pap and pelvic exam, combined with testing for human papilloma virus (HPV), every 5 years. Some types of HPV increase your risk of cervical cancer. Testing for HPV may also be done on women of any age with unclear Pap test results.  Other health care providers may not recommend any screening for nonpregnant women who are considered low risk for pelvic cancer and who do not have symptoms. Ask your health care provider if a screening pelvic exam is right for you.  If you have had past treatment for cervical cancer or a condition that could lead to cancer, you need Pap tests and screening for cancer for at least 20 years after your treatment. If Pap tests have been discontinued, your risk factors (such as having a new sexual partner) need to be reassessed to determine if screening should resume. Some women have medical problems that increase the chance of getting cervical cancer. In these cases, your health care provider may recommend more frequent screening and Pap tests. Colorectal Cancer  This type of cancer can be detected and often prevented.  Routine colorectal cancer screening usually begins at 57 years of age and continues through  57 years of age.  Your health care provider may recommend screening at an earlier age if you have risk factors for colon cancer.  Your health care provider may also recommend using home test kits to check for hidden blood in the stool.  A small camera at the end of a tube can be used to examine your colon directly (sigmoidoscopy or colonoscopy). This is done to check for the earliest forms of colorectal cancer.  Routine screening usually begins at age 59.  Direct examination of the colon should be repeated every 5-10 years through 58 years of age. However, you may need to be screened more often if early forms of precancerous polyps or small growths are found. Skin Cancer  Check your skin from head to toe regularly.  Tell your health care provider about any new moles or changes in moles, especially if there is a change in a mole's shape or color.  Also tell your health care provider if you have a mole that is larger than the size of a pencil eraser.  Always use sunscreen. Apply sunscreen liberally and repeatedly throughout the day.  Protect yourself by wearing long sleeves, pants, a wide-brimmed hat, and sunglasses whenever you are outside. HEART DISEASE, DIABETES, AND HIGH BLOOD PRESSURE   High blood pressure causes heart disease and increases the risk of stroke. High blood pressure is more likely to develop in:  People who have blood pressure in the high end of the normal range (130-139/85-89 mm Hg).  People who are overweight or obese.  People who are African American.  If you are 60-93 years of age, have your blood pressure checked every 3-5 years. If you are 86 years of age or older, have your blood pressure checked every year. You should have your blood pressure measured twice--once when you are at a hospital or clinic, and once when you are not at a hospital or clinic. Record the average of the two measurements. To check your blood pressure when you are not at a hospital or  clinic, you can use:  An automated blood pressure machine at a pharmacy.  A home blood pressure monitor.  If you are between 48 years and 65 years old, ask your health care provider if you should take aspirin to prevent strokes.  Have regular diabetes screenings. This involves taking a blood sample to check your fasting blood sugar level.  If you are at a normal weight and have a low risk for diabetes, have this test once every three years after 57 years of age.  If you are overweight and have a high risk for diabetes, consider being tested at a younger age or more often. PREVENTING INFECTION  Hepatitis B  If you have a higher risk for hepatitis B, you should be screened for this virus. You are considered at high risk for hepatitis B if:  You were born in a country where hepatitis B is common. Ask your health care provider which countries are considered high risk.  Your parents were born in a high-risk country, and you have not been immunized against hepatitis B (hepatitis B vaccine).  You have HIV or AIDS.  You use needles to inject street drugs.  You live with someone who has hepatitis B.  You have had sex with someone who has hepatitis B.  You get hemodialysis treatment.  You take certain medicines for conditions, including cancer, organ transplantation, and autoimmune conditions. Hepatitis C  Blood testing is recommended for:  Everyone born from [birth year] through 1967.  Anyone with known risk factors for hepatitis C. Sexually transmitted infections (STIs)  You should be screened for sexually transmitted infections (STIs) including gonorrhea and chlamydia if:  You are sexually active and are younger than 57 years of age.  You are older than 57 years of age and your health care provider tells you that you are at risk for this type of infection.  Your sexual activity has changed since you were last screened and you are at an increased risk for chlamydia or gonorrhea. Ask  your health care provider if you are at risk.  If you do not have HIV, but are at risk, it may be recommended that you take a prescription medicine daily to prevent HIV infection. This is called pre-exposure prophylaxis (PrEP). You are considered at risk if:  You are sexually active and do not regularly use condoms or know the HIV status of your partner(s).  You take drugs by injection.  You are sexually active with a partner who has HIV. Talk with your health  care provider about whether you are at high risk of being infected with HIV. If you choose to begin PrEP, you should first be tested for HIV. You should then be tested every 3 months for as long as you are taking PrEP.  PREGNANCY   If you are premenopausal and you may become pregnant, ask your health care provider about preconception counseling.  If you may become pregnant, take 400 to 800 micrograms (mcg) of folic acid every day.  If you want to prevent pregnancy, talk to your health care provider about birth control (contraception). OSTEOPOROSIS AND MENOPAUSE   Osteoporosis is a disease in which the bones lose minerals and strength with aging. This can result in serious bone fractures. Your risk for osteoporosis can be identified using a bone density scan.  If you are 26 years of age or older, or if you are at risk for osteoporosis and fractures, ask your health care provider if you should be screened.  Ask your health care provider whether you should take a calcium or vitamin D supplement to lower your risk for osteoporosis.  Menopause may have certain physical symptoms and risks.  Hormone replacement therapy may reduce some of these symptoms and risks. Talk to your health care provider about whether hormone replacement therapy is right for you.  HOME CARE INSTRUCTIONS   Schedule regular health, dental, and eye exams.  Stay current with your immunizations.   Do not use any tobacco products including cigarettes, chewing  tobacco, or electronic cigarettes.  If you are pregnant, do not drink alcohol.  If you are breastfeeding, limit how much and how often you drink alcohol.  Limit alcohol intake to no more than 1 drink per day for nonpregnant women. One drink equals 12 ounces of beer, 5 ounces of wine, or 1 ounces of hard liquor.  Do not use street drugs.  Do not share needles.  Ask your health care provider for help if you need support or information about quitting drugs.  Tell your health care provider if you often feel depressed.  Tell your health care provider if you have ever been abused or do not feel safe at home.   This information is not intended to replace advice given to you by your health care provider. Make sure you discuss any questions you have with your health care provider.   Document Released: 08/29/2010 Document Revised: 03/06/2014 Document Reviewed: 01/15/2013 Elsevier Interactive Patient Education [employer].

## 2015-12-07 NOTE — Assessment & Plan Note (Signed)
USPSTF grade A and B recommendations reviewed with patient; age-appropriate recommendations, preventive care, screening tests, etc discussed and encouraged; healthy living encouraged; see AVS for patient education given to patient  

## 2015-12-08 LAB — HEPATITIS C ANTIBODY: HCV Ab: NEGATIVE

## 2015-12-09 LAB — HUMAN PAPILLOMAVIRUS, HIGH RISK: HPV DNA High Risk: NOT DETECTED

## 2015-12-15 ENCOUNTER — Ambulatory Visit
Admission: RE | Admit: 2015-12-15 | Discharge: 2015-12-15 | Disposition: A | Discharge status: Home / Self Care | Payer: BC Managed Care – PPO | Source: Ambulatory Visit | Attending: Family Medicine | Admitting: Family Medicine

## 2015-12-15 DIAGNOSIS — Z1231 Encounter for screening mammogram for malignant neoplasm of breast: Secondary | ICD-10-CM | POA: Diagnosis present

## 2015-12-15 DIAGNOSIS — Z1239 Encounter for other screening for malignant neoplasm of breast: Secondary | ICD-10-CM

## 2015-12-28 ENCOUNTER — Encounter: Payer: Self-pay | Admitting: Obstetrics and Gynecology

## 2015-12-28 ENCOUNTER — Ambulatory Visit (INDEPENDENT_AMBULATORY_CARE_PROVIDER_SITE_OTHER): Payer: BC Managed Care – PPO | Admitting: Obstetrics and Gynecology

## 2015-12-28 VITALS — BP 105/62 | HR 80 | Ht 66.0 in | Wt 132.2 lb

## 2015-12-28 DIAGNOSIS — N952 Postmenopausal atrophic vaginitis: Secondary | ICD-10-CM | POA: Diagnosis not present

## 2015-12-28 DIAGNOSIS — N889 Noninflammatory disorder of cervix uteri, unspecified: Secondary | ICD-10-CM | POA: Diagnosis not present

## 2015-12-28 NOTE — Progress Notes (Signed)
    GYNECOLOGY PROGRESS NOTE  Subjective:    Patient ID: Caroline Harmon, female    DOB: [birth year], 55 y.o.   MRN: [id number]  HPI  Patient is a 55 y.o. G82P2002 female who presents as a referral from Dr. Enid Derry [facility]) for abnormal appearing cervix (noted glandular appearing tissue at cervical os and friability). Patient currently denies complaints, denies abnormal bleeding.   Of note, patient was sent last year for similar issue.   The following portions of the patient's history were reviewed and updated as appropriate: allergies, current medications, past family history, past medical history, past social history, past surgical history and problem list.  Review of Systems Pertinent items noted in HPI and remainder of comprehensive ROS otherwise negative.    Objective:   Blood pressure 105/62, pulse 80, height 5\' 6"  (1.676 m), weight 132 lb 3.2 oz (60 kg). General appearance: alert and no distress Abdomen: soft, non-tender; bowel sounds normal; no masses,  no organomegaly Pelvic: external genitalia normal, rectovaginal septum normal.  Vagina with moderate atrophy. No vaginal discharge. Cervix with glandular appearing tissue at cervical os, slightly friable .  Uterus mobile, nontender, normal shape and size.  Adnexae non-palpable, nontender bilaterally.    Assessment:   - Postmenopausal cervical changes - Vaginal and cervical atrophy  Plan:   - Patient with h/o normal pap smear last year, cervical biopsy with normal cells and chronic cervicitis, treated with Doxycyline.  Pap smear ordered last visit with Dr. Sanda Klein (12/07/15) however nothing resulted.  Contacted [facility] who noted no specimen received.  Repeat pap performed today.   - If pap smear normal, will try use of Premarin cream for vaginal and cervical atrophy. Given 2 samples of Premarin cream as long as pap smear comes back negative.  If abnormal, patient will need to return for colposcopy with possible  biopsy.    [name], MD [facility]

## 2016-01-04 LAB — PAPIG, HPV, RFX 16/18
HPV, HIGH-RISK: NEGATIVE
PAP Smear Comment: 0

## 2016-01-13 ENCOUNTER — Encounter: Payer: Self-pay | Admitting: Family Medicine

## 2016-01-13 ENCOUNTER — Ambulatory Visit (INDEPENDENT_AMBULATORY_CARE_PROVIDER_SITE_OTHER): Payer: BC Managed Care – PPO | Admitting: Family Medicine

## 2016-01-13 DIAGNOSIS — L821 Other seborrheic keratosis: Secondary | ICD-10-CM

## 2016-01-13 DIAGNOSIS — I781 Nevus, non-neoplastic: Secondary | ICD-10-CM | POA: Diagnosis not present

## 2016-01-13 DIAGNOSIS — D485 Neoplasm of uncertain behavior of skin: Secondary | ICD-10-CM

## 2016-01-13 DIAGNOSIS — D1801 Hemangioma of skin and subcutaneous tissue: Secondary | ICD-10-CM

## 2016-01-13 NOTE — Assessment & Plan Note (Signed)
Explained this appears benign; likely related to [place] ancestry, like the SK; should not ever cause problems

## 2016-01-13 NOTE — Assessment & Plan Note (Addendum)
Refer to derm; this lesion is irregular, appears to have very darkly pigmented area and needs excisional biopsy

## 2016-01-13 NOTE — Patient Instructions (Addendum)
The place on the right hip is called a seborrheic keratosis The little red dots are called cherry angiomas We'll have you see a dermatologist about the place on the back above the bra line Do wear sunscreen and avoid peak sun hours

## 2016-01-13 NOTE — Assessment & Plan Note (Signed)
Lesion which brought her in on the right hip appears to be a seborrheic keratosis, benign

## 2016-01-13 NOTE — Progress Notes (Signed)
BP 126/82 (BP Location: Left Arm, Patient Position: Sitting, Cuff Size: Normal)   Pulse 87   Temp 98.1 F (36.7 C) (Oral)   Resp 14   Wt 132 lb 4 oz (60 kg)   SpO2 98%   BMI 21.35 kg/m    Subjective:    Patient ID: Caroline Harmon, female    DOB: Sep 28, 1960, 55 y.o.   MRN: [id number]  HPI: Caroline Harmon is a 55 y.o. female  Chief Complaint  Patient presents with  . 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 Migraine   . Osteoporosis    Past Surgical History:  Procedure Laterality Date  . CAROTID BODY TUMOR EXCISION     benign  . COLONOSCOPY  10/06/13   Family History  Problem Relation Age of Onset  . Migraines Mother   . Heart attack Father   . Cancer Father     skin  . Hypertension Father    Social History  Substance Use Topics  . Smoking status: Never Smoker  . Smokeless tobacco: Never Used  . Alcohol use  Yes     Comment: occassional   Interim medical history since last visit reviewed. Allergies and medications reviewed  Review of Systems Per HPI unless specifically indicated above     Objective:    BP 126/82 (BP Location: Left Arm, Patient Position: Sitting, Cuff Size: Normal)   Pulse 87   Temp 98.1 F (36.7 C) (Oral)   Resp 14   Wt 132 lb 4 oz (60 kg)   SpO2 98%   BMI 21.35 kg/m   Wt Readings from Last 3 Encounters:  01/13/16 132 lb 4 oz (60 kg)  12/28/15 132 lb 3.2 oz (60 kg)  12/07/15 130 lb 14.4 oz (59.4 kg)    Physical Exam  Constitutional: She appears well-developed and well-nourished.  Cardiovascular: Normal rate.   Pulmonary/Chest: Effort normal.  Skin: Lesion (8x7 mm irregular slightly papular lesion with 1.5 x 1.5 mm dark pigmented area near the middle, irregular borders) noted. She is not diaphoretic. No pallor.     5x4 mm very well demarcated oval lesion with rough brownish-grey keratotic surface; several scattered brown keratotic lesions in Christmas tree fashion on the back; few scattered bright  red cherry angiomas    Results for orders placed or performed in visit on 12/28/15  PapIG, HPV, rfx 16/18  Result Value Ref Range   DIAGNOSIS: Comment    Specimen adequacy: Comment    CLINICIAN PROVIDED ICD10: Comment    Performed by: Comment    QC reviewed by: Comment    PAP SMEAR COMMENT .    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  An after-visit summary was printed and given to the patient at [facility].  Please see the patient instructions which may contain other information and recommendations beyond what is mentioned above in the assessment and plan.  No orders of the defined types were placed in this encounter.   Orders Placed This Encounter  Procedures  . Ambulatory referral to Dermatology

## 2016-01-31 ENCOUNTER — Other Ambulatory Visit: Payer: Self-pay | Admitting: Family Medicine

## 2016-01-31 NOTE — Telephone Encounter (Signed)
Please call and ask patient about her migraine frequency I approved 10+1 pills in October less than two months ago and she's requesting more already Is she really having 10 migraines a month? If so, offer an appointment and we'll talk about preventive medicine

## 2016-02-01 ENCOUNTER — Other Ambulatory Visit: Payer: Self-pay

## 2016-02-01 ENCOUNTER — Other Ambulatory Visit: Payer: Self-pay | Admitting: Family Medicine

## 2016-02-01 NOTE — Telephone Encounter (Signed)
Roselyn Reef or [employer], please call the patient; see previous notes

## 2016-02-01 NOTE — Telephone Encounter (Signed)
Husband states sometimes less than 10 sometimes more but she has had for years and has tried several medications with no relief has even seen specialist, this is the only medication that works for her?

## 2016-02-01 NOTE — Telephone Encounter (Signed)
Okay, thank you; Rx sent Please suggest the following over-the-counter supplements for her: Magnesium Riboflavin CoQ10 I don't have specific doses, just whatever usual dose her pharmacist carries should be adequate, then follow directions on the package These may help lessen frequency of migraines

## 2016-02-01 NOTE — Telephone Encounter (Signed)
Please see my note from yesterday (copied and pasted below): Please call and ask patient about her migraine frequency I approved 10+1 pills in October less than two months ago and she's requesting more already Is she really having 10 migraines a month? If so, offer an appointment and we'll talk about preventive medicine

## 2016-02-01 NOTE — Telephone Encounter (Signed)
I think she meant to send this to you :)

## 2016-05-22 ENCOUNTER — Other Ambulatory Visit: Payer: Self-pay | Admitting: Family Medicine

## 2016-08-21 ENCOUNTER — Encounter: Payer: Self-pay | Admitting: Family Medicine

## 2016-08-21 ENCOUNTER — Ambulatory Visit (INDEPENDENT_AMBULATORY_CARE_PROVIDER_SITE_OTHER): Payer: BC Managed Care – PPO | Admitting: Family Medicine

## 2016-08-21 VITALS — BP 116/80 | HR 83 | Temp 97.8°F | Resp 16 | Wt 136.7 lb

## 2016-08-21 DIAGNOSIS — M20012 Mallet finger of left finger(s): Secondary | ICD-10-CM

## 2016-08-21 NOTE — Progress Notes (Addendum)
Name: Caroline Harmon   MRN: [id number]    DOB: 08/08/1960   Date:08/21/2016       Progress Note  Subjective  Chief Complaint  Chief Complaint  Patient presents with  . Hand Pain    POSS broken left ring finger playing kick ball. 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 The DIP joint is flexed and unable to be straightened completely. She is able to make a fist and move her hand well otherwise, and doesn't have pain except when she touches that joint. She has been wearing a finger brace since the incident in hopes that it would heal on its own. No fevers or chills, no drainage or bleeding or wound at site of injury.  Patient Active Problem List   Diagnosis Date Noted  . 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 Seborrheic keratosis 01/13/2016  . Cherry angioma 01/13/2016  . Abnormal appearance of cervix 11/18/2014  . Preventative health care 11/12/2014  . Screening for cervical cancer 11/12/2014  . Migraine   . Osteoporosis   . Benign tumor of carotid body     Social History  Substance Use Topics  . Smoking status: Never Smoker  . Smokeless tobacco: Never Used  . Alcohol use Yes     Comment: occassional     Current Outpatient Prescriptions:  .  eletriptan (RELPAX) 40 MG tablet, Take 1 tablet (40 mg total) by mouth as needed for migraine or headache. May repeat in 2 hours if headache persists or recurs., Disp: 10 tablet, Rfl: 5  Allergies  Allergen Reactions  . Topamax [Topiramate] Other (See Comments)    Hematuria    ROS  Ten systems reviewed and is negative except as mentioned in HPI  Objective  Vitals:   08/21/16 0833  BP: 116/80  Pulse: 83  Resp: 16  Temp: 97.8 F (36.6 C)  TempSrc: Oral  SpO2: 97%  Weight: 136 lb 11.2 oz (62 kg)    Body mass index is 22.06  kg/m.  Nursing Note and Vital Signs reviewed.  Physical Exam  Constitutional: Patient appears well-developed and well-nourished. Obese No distress.  HEENT: head atraumatic, normocephalic  Cardiovascular: Normal rate, regular rhythm, S1/S2 present.  No murmur or rub heard. No BLE edema. Pulmonary/Chest: Effort normal and breath sounds clear. No respiratory distress or retractions. Psychiatric: Patient has a normal mood and affect. behavior is normal. Judgment and thought content normal. MSK: Left ring finger at DIP joint exhibits redness and swelling with no fluctuance. Joint is unable to be moved from a flexed position, tenderness on palpation to DIP joint only - remaining joints of left ring finger and left hand are non-tender. Cap refill <3 seconds, radial pulse +2.  No results found for this or any previous visit (from the past 2160 hour(s)).   Assessment & Plan  1. Mallet deformity of left ring finger - Ambulatory referral to Hand Surgery  -Red flags and when to present for emergency care or RTC including fever >101.59F, chest pain, shortness of breath, new/worsening/un-resolving symptoms, significant swelling or drainage from wound reviewed with patient at time of visit. Follow up and care instructions discussed and provided in AVS. -Reviewed Health Maintenance: Will schedule annual physical in October when due.  I have reviewed this encounter including the  documentation in this note and/or discussed this patient with the Johney Maine, FNP, NP-C. I am certifying that I agree with the content of this note as supervising physician.  Steele Sizer, MD [facility] 08/21/2016, 10:44 AM

## 2016-10-31 ENCOUNTER — Other Ambulatory Visit: Payer: Self-pay | Admitting: Family Medicine

## 2016-11-02 ENCOUNTER — Other Ambulatory Visit: Payer: Self-pay

## 2016-11-04 ENCOUNTER — Other Ambulatory Visit: Payer: Self-pay | Admitting: Family Medicine

## 2016-11-06 ENCOUNTER — Ambulatory Visit (INDEPENDENT_AMBULATORY_CARE_PROVIDER_SITE_OTHER): Payer: BC Managed Care – PPO | Admitting: Family Medicine

## 2016-11-06 ENCOUNTER — Encounter: Payer: Self-pay | Admitting: Family Medicine

## 2016-11-06 VITALS — BP 108/72 | HR 82 | Temp 98.1°F | Resp 16 | Ht 66.0 in | Wt 137.9 lb

## 2016-11-06 DIAGNOSIS — G43709 Chronic migraine without aura, not intractable, without status migrainosus: Secondary | ICD-10-CM | POA: Diagnosis not present

## 2016-11-06 MED ORDER — ELETRIPTAN HYDROBROMIDE 40 MG PO TABS: ORAL_TABLET | ORAL | 5 refills | Status: DC

## 2016-11-06 NOTE — Patient Instructions (Addendum)
Aimovig - Review packet, call us if you would like to start.

## 2016-11-06 NOTE — Addendum Note (Signed)
Addended by: Hubbard Hartshorn on: 11/06/2016 04:23 PM   Modules accepted: Orders

## 2016-11-06 NOTE — Progress Notes (Addendum)
Name: Caroline Harmon   MRN: [id number]    DOB: 1960/11/07   Date:11/06/2016       Progress Note  Subjective  Chief Complaint  Chief Complaint  Patient presents with  . 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  She has been to the headache clinic with [facility] and she is now only using Relpax as needed. She does not have any foods that she has noticed that trigger her migraines.  Usually migraines are mild to moderate; but occasionally consist of nausea, vomiting, photophobia if she does not take her Relpax in time. No aura.  Patient Active Problem List   Diagnosis Date Noted  . 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 Seborrheic keratosis 01/13/2016  . Cherry angioma 01/13/2016  . Abnormal appearance of cervix 11/18/2014  . Preventative health care 11/12/2014  . Screening for cervical cancer 11/12/2014  . Migraine   . Osteoporosis   . Benign tumor of carotid body     Social History  Substance Use Topics  . Smoking status: Never Smoker  . Smokeless tobacco: Never Used  . Alcohol use Yes     Comment: occassional    Current Outpatient Prescriptions:  .  eletriptan (RELPAX) 40 MG tablet, Take 1 tablet (40 mg total) by mouth as needed for migraine or headache. May repeat in 2 hours if headache persists or recurs., Disp: 10 tablet, Rfl: 5  Allergies  Allergen Reactions  . Topamax [Topiramate] Other (See Comments)    Hematuria    ROS  Constitutional: Negative for fever or weight change.  Respiratory: Negative for cough and shortness of breath.   Cardiovascular: Negative for chest pain or palpitations.  Gastrointestinal: Negative for abdominal pain, no bowel changes.  Musculoskeletal: Negative for gait problem or joint swelling.  Skin: Negative for rash.  Neurological: Negative for  dizziness. See HPI No other specific complaints in a complete review of systems (except as listed in HPI above).  Objective  Vitals:   11/06/16 1549  BP: 108/72  Pulse: 82  Resp: 16  Temp: 98.1 F (36.7 C)  TempSrc: Oral  SpO2: 98%  Weight: 137 lb 14.4 oz (62.6 kg)  Height: 5\' 6"  (1.676 m)   Body mass index is 22.26 kg/m.  Nursing Note and Vital Signs reviewed.  Physical Exam  Constitutional: Patient appears well-developed and well-nourished.  No distress.  HEENT: head atraumatic, normocephalic, pupils equal and reactive to light, EOM's intact, neck supple without lymphadenopathy, oropharynx pink and moist without exudate Cardiovascular: Normal rate, regular rhythm, S1/S2 present.  No murmur or rub heard. No BLE edema. Pulmonary/Chest: Effort normal and breath sounds clear. No respiratory distress or retractions. Psychiatric: Patient has a normal mood and affect. behavior is normal. Judgment and thought content normal. Musculoskeletal: Normal range of motion, no joint effusions. No gross deformities Neurological: she is alert and oriented to person, place, and time. No cranial nerve deficit. Coordination, balance, strength, speech and gait are normal. No photophobia, no nystagmus Skin: Skin is warm and dry. No rash noted. No erythema.  Psychiatric: Patient has a normal mood and affect. behavior is normal. Judgment and thought content normal.  No results found for this or any previous visit (from the past 2160 hour(s)).   Assessment & Plan  1. Chronic migraine without aura  without status migrainosus, not intractable - eletriptan (RELPAX) 40 MG tablet; Take 1 tablet (40 mg total) by mouth as needed for migraine or headache. May repeat in 2 hours if headache persists or recurs.  Dispense: 10 tablet; Refill: 5 - We discussed possibility of starting Aimovig for prevention; patient is given packet to review literature and decide if she wants to try this; if she does want to try she  will call back and we will schedule appointment to discuss.   I have reviewed this encounter including the documentation in this note and/or discussed this patient with the Johney Maine, FNP, NP-C. I am certifying that I agree with the content of this note as supervising physician.  Steele Sizer, MD [facility] 11/11/2016, 8:08 PM

## 2016-12-08 ENCOUNTER — Encounter: Payer: BC Managed Care – PPO | Admitting: Family Medicine

## 2016-12-26 ENCOUNTER — Ambulatory Visit (INDEPENDENT_AMBULATORY_CARE_PROVIDER_SITE_OTHER): Payer: BC Managed Care – PPO | Admitting: Family Medicine

## 2016-12-26 ENCOUNTER — Encounter: Payer: Self-pay | Admitting: Family Medicine

## 2016-12-26 VITALS — BP 116/78 | HR 91 | Temp 98.0°F | Ht 66.03 in | Wt 135.2 lb

## 2016-12-26 DIAGNOSIS — Z1231 Encounter for screening mammogram for malignant neoplasm of breast: Secondary | ICD-10-CM

## 2016-12-26 DIAGNOSIS — M816 Localized osteoporosis [Lequesne]: Secondary | ICD-10-CM

## 2016-12-26 DIAGNOSIS — Z0000 Encounter for general adult medical examination without abnormal findings: Secondary | ICD-10-CM

## 2016-12-26 NOTE — Progress Notes (Signed)
Patient ID: Caroline Harmon, female   DOB: 1961-02-11, 56 y.o.   MRN: [id number]   Subjective:   Caroline Harmon is a 55 y.o. female here for a complete physical exam  Interim issues since last visit: [place] excised from back; saw Dr. Marcelline Mates for abnormal cervix; had pap smear last halloween   USPSTF grade A and B recommendations Depression:  Depression screen [facility] 2/9 12/26/2016 08/21/2016 01/13/2016 12/07/2015 11/12/2014  Decreased Interest 0 0 0 0 0  Down, Depressed, Hopeless 0 0 0 0 0  PHQ - 2 Score 0 0 0 0 0   Hypertension: BP Readings from Last 3 Encounters:  12/26/16 116/78  11/06/16 108/72  08/21/16 116/80   Obesity: Wt Readings from Last 3 Encounters:  12/26/16 135 lb 3.2 oz (61.3 kg)  11/06/16 137 lb 14.4 oz (62.6 kg)  08/21/16 136 lb 11.2 oz (62 kg)   BMI Readings from Last 3 Encounters:  12/26/16 21.81 kg/m  11/06/16 22.26 kg/m  08/21/16 22.06 kg/m    Skin cancer: no worrisome moles; had that place on her back taken off; we'll check back well; she looks at her legs and other places except for her back; sees dermatologist, they removed the skin cancer on her back; went to [facility] in [place]; had excision and went back for further removal; path report on 02/07/16 showed cleared margins, no residual BCC Lung cancer:  Never smokers Breast cancer: no lumps; will get mammogram, got reminder card and she'll call for appt Colorectal cancer: 2015; ten year pass  BRCA gene screening: family hx of breast and/or ovarian cancer and/or metastatic prostate cancer? NO Cervical cancer screening: goes to see Dr. Marcelline Mates; has had abnormal cells on the pap smears HIV, hep B, hep C: hep C done 2017 STD testing and prevention (chl/gon/syphilis):  Not interested Intimate partner violence: no abuse Contraception: postmenopausal Osteoporosis: DEXA ordered Fall prevention/vitamin D: discussed  Diet: pretty good eater; "I eat real well"; getting calcium, no cow's milk, loves kale  and spinach Exercise: does walk in the evenings some; weekends some; safe Alcohol: occasional wine, 4 glasses a week Tobacco use: never Aspirin: start Lipids:  Lab Results  Component Value Date   CHOL 194 12/26/2016   CHOL 184 12/07/2015   CHOL 175 11/12/2014   Lab Results  Component Value Date   HDL 94 12/26/2016   HDL 93 12/07/2015   HDL 100 11/12/2014   Lab Results  Component Value Date   LDLCALC 82 12/07/2015   LDLCALC 65 11/12/2014   Lab Results  Component Value Date   TRIG 60 12/26/2016   TRIG 47 12/07/2015   TRIG 48 11/12/2014   Lab Results  Component Value Date   CHOLHDL 2.1 12/26/2016   CHOLHDL 2.0 12/07/2015   No results found for: LDLDIRECT Glucose:  Glucose  Date Value Ref Range Status  11/12/2014 88 65 - 99 mg/dL Final   Glucose, Bld  Date Value Ref Range Status  12/26/2016 81 65 - 99 mg/dL Final    Comment:    .            Fasting reference interval .   12/07/2015 84 65 - 99 mg/dL Final   AAA: n/a  Past Medical History:  Diagnosis Date  . 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 Migraine   . Osteoporosis    Past Surgical History:  Procedure Laterality Date  . CAROTID BODY TUMOR EXCISION     benign  .  COLONOSCOPY  10/06/13   Family History  Problem Relation Age of Onset  . Migraines Mother   . Heart attack Father   . Cancer Father        skin  . Hypertension Father    Social History  Substance Use Topics  . Smoking status: Never Smoker  . Smokeless tobacco: Never Used  . Alcohol use Yes     Comment: occassional   Review of Systems  Objective:   Vitals:   12/26/16 1441  BP: 116/78  Pulse: 91  Temp: 98 F (36.7 C)  TempSrc: Oral  SpO2: 98%  Weight: 135 lb 3.2 oz (61.3 kg)  Height: 5' 6.02" (1.677 m)   Body mass index is 21.81 kg/m. Wt Readings from Last 3 Encounters:  12/26/16 135 lb 3.2 oz (61.3 kg)  11/06/16 137 lb 14.4 oz (62.6 kg)  08/21/16 136 lb 11.2 oz (62 kg)   Physical Exam    Constitutional: She appears well-developed and well-nourished.  HENT:  Head: Normocephalic and atraumatic.  Right Ear: Hearing, tympanic membrane, external ear and ear canal normal.  Left Ear: Hearing, tympanic membrane, external ear and ear canal normal.  Eyes: Conjunctivae and EOM are normal. Right eye exhibits no hordeolum. Left eye exhibits no hordeolum. No scleral icterus.  Neck: Carotid bruit is not present. No thyromegaly present.  Cardiovascular: Normal rate, regular rhythm, S1 normal, S2 normal and normal heart sounds.   No extrasystoles are present.  Pulmonary/Chest: Effort normal and breath sounds normal. No respiratory distress. Right breast exhibits no inverted nipple, no mass, no nipple discharge, no skin change and no tenderness. Left breast exhibits no inverted nipple, no mass, no nipple discharge, no skin change and no tenderness. Breasts are symmetrical.  Abdominal: Soft. Normal appearance and bowel sounds are normal. She exhibits no distension, no abdominal bruit, no pulsatile midline mass and no mass. There is no hepatosplenomegaly. There is no tenderness. No hernia.  Musculoskeletal: Normal range of motion. She exhibits no edema.  Lymphadenopathy:       Head (right side): No submandibular adenopathy present.       Head (left side): No submandibular adenopathy present.    She has no cervical adenopathy.    She has no axillary adenopathy.  Neurological: She is alert. She displays no tremor. No cranial nerve deficit. She exhibits normal muscle tone. Gait normal.  Reflex Scores:      Patellar reflexes are 2+ on the right side and 2+ on the left side. Skin: Skin is warm and dry. No bruising and no ecchymosis noted. No cyanosis. No pallor.     Surgical scar across the back; no evidence of telangiectasia, altered pigmentation, nodularity, erythema  Psychiatric: Her speech is normal and behavior is normal. Thought content normal. Her mood appears not anxious. She does not  exhibit a depressed mood.    Assessment/Plan:   Problem List Items Addressed This Visit      Musculoskeletal and Integument   Osteoporosis   Relevant Orders   DG Bone Density   VITAMIN D 25 Hydroxy (Vit-D Deficiency, Fractures) (Completed)     Other   Preventative health care - Primary    USPSTF grade A and B recommendations reviewed with patient; age-appropriate recommendations, preventive care, screening tests, etc discussed and encouraged; healthy living encouraged; see AVS for patient education given to patient She will get her pap smear through the gynecologist      Relevant Orders   DG Bone Density   CBC with Differential/Platelet (  Completed)   COMPLETE METABOLIC PANEL WITH GFR (Completed)   Lipid panel (Completed)   TSH (Completed)    Other Visit Diagnoses    Encounter for screening mammogram for breast cancer       Relevant Orders   MM DIGITAL SCREENING BILATERAL       Meds ordered this encounter  Medications  . aspirin EC 81 MG tablet    Sig: Take 1 tablet (81 mg total) by mouth daily.   Orders Placed This Encounter  Procedures  . MM DIGITAL SCREENING BILATERAL    Standing Status:   Future    Standing Expiration Date:   02/25/2018    Order Specific Question:   Reason for Exam (SYMPTOM  OR DIAGNOSIS REQUIRED)    Answer:   Screening for breast cancer    Order Specific Question:   Is the patient pregnant?    Answer:   No    Order Specific Question:   Preferred imaging location?    Answer:   [facility]  . DG Bone Density    Standing Status:   Future    Standing Expiration Date:   02/25/2018    Order Specific Question:   Reason for Exam (SYMPTOM  OR DIAGNOSIS REQUIRED)    Answer:   Osteoporosis    Order Specific Question:   Is the patient pregnant?    Answer:   No    Order Specific Question:   Preferred imaging location?    Answer:   [facility]  . CBC with Differential/Platelet  . COMPLETE METABOLIC PANEL WITH GFR  . Lipid panel  . TSH   . VITAMIN D 25 Hydroxy (Vit-D Deficiency, Fractures)    Follow up plan: Return in about 1 year (around 12/26/2017) for complete physical.  An After Visit Summary was printed and given to the patient.

## 2016-12-26 NOTE — Assessment & Plan Note (Addendum)
USPSTF grade A and B recommendations reviewed with patient; age-appropriate recommendations, preventive care, screening tests, etc discussed and encouraged; healthy living encouraged; see AVS for patient education given to patient She will get her pap smear through the gynecologist

## 2016-12-26 NOTE — Patient Instructions (Addendum)
We'll get labs today Please call Dr. Valentino Saxon to schedule your pap smear with her Please do call to schedule your bone density study and mammogram; the number to schedule one at either [facility] or [facility] is [phone] or [phone]  Health Maintenance, Female Adopting a healthy lifestyle and getting preventive care can go a long way to promote health and wellness. Talk with your health care provider about what schedule of regular examinations is right for you. This is a good chance for you to check in with your provider about disease prevention and staying healthy. In between checkups, there are plenty of things you can do on your own. Experts have done a lot of research about which lifestyle changes and preventive measures are most likely to keep you healthy. Ask your health care provider for more information. Weight and diet Eat a healthy diet  Be sure to include plenty of vegetables, fruits, low-fat dairy products, and lean protein.  Do not eat a lot of foods high in solid fats, added sugars, or salt.  Get regular exercise. This is one of the most important things you can do for your health. ? Most adults should exercise for at least 150 minutes each week. The exercise should increase your heart rate and make you sweat (moderate-intensity exercise). ? Most adults should also do strengthening exercises at least twice a week. This is in addition to the moderate-intensity exercise.  Maintain a healthy weight  Body mass index (BMI) is a measurement that can be used to identify possible weight problems. It estimates body fat based on height and weight. Your health care provider can help determine your BMI and help you achieve or maintain a healthy weight.  For females 46 years of age and older: ? A BMI below 18.5 is considered underweight. ? A BMI of 18.5 to 24.9 is normal. ? A BMI of 25 to 29.9 is considered overweight. ? A BMI of 30 and above is  considered obese.  Watch levels of cholesterol and blood lipids  You should start having your blood tested for lipids and cholesterol at 58 years of age, then have this test every 5 years.  You may need to have your cholesterol levels checked more often if: ? Your lipid or cholesterol levels are high. ? You are older than 58 years of age. ? You are at high risk for heart disease.  Cancer screening Lung Cancer  Lung cancer screening is recommended for adults 29-71 years old who are at high risk for lung cancer because of a history of smoking.  A yearly low-dose CT scan of the lungs is recommended for people who: ? Currently smoke. ? Have quit within the past 15 years. ? Have at least a 30-pack-year history of smoking. A pack year is smoking an average of one pack of cigarettes a day for 1 year.  Yearly screening should continue until it has been 15 years since you quit.  Yearly screening should stop if you develop a health problem that would prevent you from having lung cancer treatment.  Breast Cancer  Practice breast self-awareness. This means understanding how your breasts normally appear and feel.  It also means doing regular breast self-exams. Let your health care provider know about any changes, no matter how small.  If you are in your 20s or 30s, you should have a clinical breast exam (CBE) by a health care provider every 1-3 years as part of a regular health exam.  If you are 40 or older, have a CBE every year. Also consider having a breast X-ray (mammogram) every year.  If you have a family history of breast cancer, talk to your health care provider about genetic screening.  If you are at high risk for breast cancer, talk to your health care provider about having an MRI and a mammogram every year.  Breast cancer gene (BRCA) assessment is recommended for women who have family members with BRCA-related cancers. BRCA-related cancers  include: ? Breast. ? Ovarian. ? Tubal. ? Peritoneal cancers.  Results of the assessment will determine the need for genetic counseling and BRCA1 and BRCA2 testing.  Cervical Cancer Your health care provider may recommend that you be screened regularly for cancer of the pelvic organs (ovaries, uterus, and vagina). This screening involves a pelvic examination, including checking for microscopic changes to the surface of your cervix (Pap test). You may be encouraged to have this screening done every 3 years, beginning at age 21.  For women ages 30-65, health care providers may recommend pelvic exams and Pap testing every 3 years, or they may recommend the Pap and pelvic exam, combined with testing for human papilloma virus (HPV), every 5 years. Some types of HPV increase your risk of cervical cancer. Testing for HPV may also be done on women of any age with unclear Pap test results.  Other health care providers may not recommend any screening for nonpregnant women who are considered low risk for pelvic cancer and who do not have symptoms. Ask your health care provider if a screening pelvic exam is right for you.  If you have had past treatment for cervical cancer or a condition that could lead to cancer, you need Pap tests and screening for cancer for at least 20 years after your treatment. If Pap tests have been discontinued, your risk factors (such as having a new sexual partner) need to be reassessed to determine if screening should resume. Some women have medical problems that increase the chance of getting cervical cancer. In these cases, your health care provider may recommend more frequent screening and Pap tests.  Colorectal Cancer  This type of cancer can be detected and often prevented.  Routine colorectal cancer screening usually begins at 58 years of age and continues through 58 years of age.  Your health care provider may recommend screening at an earlier age if you have risk factors  for colon cancer.  Your health care provider may also recommend using home test kits to check for hidden blood in the stool.  A small camera at the end of a tube can be used to examine your colon directly (sigmoidoscopy or colonoscopy). This is done to check for the earliest forms of colorectal cancer.  Routine screening usually begins at age 50.  Direct examination of the colon should be repeated every 5-10 years through 58 years of age. However, you may need to be screened more often if early forms of precancerous polyps or small growths are found.  Skin Cancer  Check your skin from head to toe regularly.  Tell your health care provider about any new moles or changes in moles, especially if there is a change in a mole's shape or color.  Also tell your health care provider if you have a mole that is larger than the size of a pencil eraser.  Always use sunscreen. Apply sunscreen liberally and repeatedly throughout the day.  Protect yourself by wearing long sleeves, pants, a wide-brimmed hat, and   sunglasses whenever you are outside.  Heart disease, diabetes, and high blood pressure  High blood pressure causes heart disease and increases the risk of stroke. High blood pressure is more likely to develop in: ? People who have blood pressure in the high end of the normal range (130-139/85-89 mm Hg). ? People who are overweight or obese. ? People who are African American.  If you are 25-70 years of age, have your blood pressure checked every 3-5 years. If you are 86 years of age or older, have your blood pressure checked every year. You should have your blood pressure measured twice-once when you are at a hospital or clinic, and once when you are not at a hospital or clinic. Record the average of the two measurements. To check your blood pressure when you are not at a hospital or clinic, you can use: ? An automated blood pressure machine at a pharmacy. ? A home blood pressure monitor.  If  you are between 34 years and 33 years old, ask your health care provider if you should take aspirin to prevent strokes.  Have regular diabetes screenings. This involves taking a blood sample to check your fasting blood sugar level. ? If you are at a normal weight and have a low risk for diabetes, have this test once every three years after 58 years of age. ? If you are overweight and have a high risk for diabetes, consider being tested at a younger age or more often. Preventing infection Hepatitis B  If you have a higher risk for hepatitis B, you should be screened for this virus. You are considered at high risk for hepatitis B if: ? You were born in a country where hepatitis B is common. Ask your health care provider which countries are considered high risk. ? Your parents were born in a high-risk country, and you have not been immunized against hepatitis B (hepatitis B vaccine). ? You have HIV or AIDS. ? You use needles to inject street drugs. ? You live with someone who has hepatitis B. ? You have had sex with someone who has hepatitis B. ? You get hemodialysis treatment. ? You take certain medicines for conditions, including cancer, organ transplantation, and autoimmune conditions.  Hepatitis C  Blood testing is recommended for: ? Everyone born from [birth year] through 1967. ? Anyone with known risk factors for hepatitis C.  Sexually transmitted infections (STIs)  You should be screened for sexually transmitted infections (STIs) including gonorrhea and chlamydia if: ? You are sexually active and are younger than 58 years of age. ? You are older than 58 years of age and your health care provider tells you that you are at risk for this type of infection. ? Your sexual activity has changed since you were last screened and you are at an increased risk for chlamydia or gonorrhea. Ask your health care provider if you are at risk.  If you do not have HIV, but are at risk, it may be recommended  that you take a prescription medicine daily to prevent HIV infection. This is called pre-exposure prophylaxis (PrEP). You are considered at risk if: ? You are sexually active and do not regularly use condoms or know the HIV status of your partner(s). ? You take drugs by injection. ? You are sexually active with a partner who has HIV.  Talk with your health care provider about whether you are at high risk of being infected with HIV. If you choose to begin PrEP, you  should first be tested for HIV. You should then be tested every 3 months for as long as you are taking PrEP. Pregnancy  If you are premenopausal and you may become pregnant, ask your health care provider about preconception counseling.  If you may become pregnant, take 400 to 800 micrograms (mcg) of folic acid every day.  If you want to prevent pregnancy, talk to your health care provider about birth control (contraception). Osteoporosis and menopause  Osteoporosis is a disease in which the bones lose minerals and strength with aging. This can result in serious bone fractures. Your risk for osteoporosis can be identified using a bone density scan.  If you are 65 years of age or older, or if you are at risk for osteoporosis and fractures, ask your health care provider if you should be screened.  Ask your health care provider whether you should take a calcium or vitamin D supplement to lower your risk for osteoporosis.  Menopause may have certain physical symptoms and risks.  Hormone replacement therapy may reduce some of these symptoms and risks. Talk to your health care provider about whether hormone replacement therapy is right for you. Follow these instructions at home:  Schedule regular health, dental, and eye exams.  Stay current with your immunizations.  Do not use any tobacco products including cigarettes, chewing tobacco, or electronic cigarettes.  If you are pregnant, do not drink alcohol.  If you are  breastfeeding, limit how much and how often you drink alcohol.  Limit alcohol intake to no more than 1 drink per day for nonpregnant women. One drink equals 12 ounces of beer, 5 ounces of wine, or 1 ounces of hard liquor.  Do not use street drugs.  Do not share needles.  Ask your health care provider for help if you need support or information about quitting drugs.  Tell your health care provider if you often feel depressed.  Tell your health care provider if you have ever been abused or do not feel safe at home. This information is not intended to replace advice given to you by your health care provider. Make sure you discuss any questions you have with your health care provider. Document Released: 08/29/2010 Document Revised: 07/22/2015 Document Reviewed: 11/17/2014 Elsevier Interactive Patient Education  2018 [employer]vier Inc.  

## 2016-12-27 LAB — CBC WITH DIFFERENTIAL/PLATELET
Basophils Absolute: 50 cells/uL (ref 0–200)
Basophils Relative: 0.8 %
Eosinophils Absolute: 151 cells/uL (ref 15–500)
Eosinophils Relative: 2.4 %
HEMATOCRIT: 37.4 % (ref 35.0–45.0)
HEMOGLOBIN: 12.8 g/dL (ref 11.7–15.5)
LYMPHS ABS: 1947 {cells}/uL (ref 850–3900)
MCH: 30.6 pg (ref 27.0–33.0)
MCHC: 34.2 g/dL (ref 32.0–36.0)
MCV: 89.5 fL (ref 80.0–100.0)
MPV: 9.4 fL (ref 7.5–12.5)
Monocytes Relative: 6.2 %
NEUTROS ABS: 3761 {cells}/uL (ref 1500–7800)
NEUTROS PCT: 59.7 %
Platelets: 294 10*3/uL (ref 140–400)
RBC: 4.18 10*6/uL (ref 3.80–5.10)
RDW: 11.7 % (ref 11.0–15.0)
Total Lymphocyte: 30.9 %
WBC: 6.3 10*3/uL (ref 3.8–10.8)
WBCMIX: 391 {cells}/uL (ref 200–950)

## 2016-12-27 LAB — TSH: TSH: 2.68 mIU/L (ref 0.40–4.50)

## 2016-12-27 LAB — COMPLETE METABOLIC PANEL WITH GFR
AG Ratio: 1.8 (calc) (ref 1.0–2.5)
ALBUMIN MSPROF: 4.6 g/dL (ref 3.6–5.1)
ALKALINE PHOSPHATASE (APISO): 71 U/L (ref 33–130)
ALT: 10 U/L (ref 6–29)
AST: 17 U/L (ref 10–35)
BILIRUBIN TOTAL: 0.6 mg/dL (ref 0.2–1.2)
BUN: 17 mg/dL (ref 7–25)
CO2: 29 mmol/L (ref 20–32)
CREATININE: 0.73 mg/dL (ref 0.50–1.05)
Calcium: 9.7 mg/dL (ref 8.6–10.4)
Chloride: 101 mmol/L (ref 98–110)
GFR, EST AFRICAN AMERICAN: 105 mL/min/{1.73_m2} (ref >=60)
GFR, Est Non African American: 91 mL/min/{1.73_m2} (ref >=60)
GLOBULIN: 2.6 g/dL (ref 1.9–3.7)
Glucose, Bld: 81 mg/dL (ref 65–99)
Potassium: 3.9 mmol/L (ref 3.5–5.3)
SODIUM: 138 mmol/L (ref 135–146)
TOTAL PROTEIN: 7.2 g/dL (ref 6.1–8.1)

## 2016-12-27 LAB — VITAMIN D 25 HYDROXY (VIT D DEFICIENCY, FRACTURES): VIT D 25 HYDROXY: 35 ng/mL (ref 30–100)

## 2016-12-27 LAB — LIPID PANEL
CHOL/HDL RATIO: 2.1 (calc) (ref <5.0)
CHOLESTEROL: 194 mg/dL (ref <200)
HDL: 94 mg/dL (ref >50)
LDL CHOLESTEROL (CALC): 85 mg/dL
Non-HDL Cholesterol (Calc): 100 mg/dL (calc) (ref <130)
Triglycerides: 60 mg/dL (ref <150)

## 2017-01-09 ENCOUNTER — Encounter: Payer: BC Managed Care – PPO | Admitting: Obstetrics and Gynecology

## 2017-01-24 ENCOUNTER — Ambulatory Visit (INDEPENDENT_AMBULATORY_CARE_PROVIDER_SITE_OTHER): Payer: BC Managed Care – PPO | Admitting: Obstetrics and Gynecology

## 2017-01-24 ENCOUNTER — Encounter: Payer: Self-pay | Admitting: Obstetrics and Gynecology

## 2017-01-24 VITALS — BP 106/63 | HR 77 | Ht 66.0 in | Wt 136.1 lb

## 2017-01-24 DIAGNOSIS — Z124 Encounter for screening for malignant neoplasm of cervix: Secondary | ICD-10-CM | POA: Diagnosis not present

## 2017-01-24 NOTE — Progress Notes (Signed)
Subjective:     Caroline Harmon is a 56 y.o. woman who comes in today for a  pap smear only. Her most recent annual exam was on 12/26/2016 with her PCP (Dr. Sanda Klein). Her most recent Pap smear was on 12/29/2015 and showed ASCUS with NEGATIVE high risk HPV. Previous abnormal Pap smears: yes - NILM but + HR HPV in 2015. Contraception: post menopausal status  The following portions of the patient's history were reviewed and updated as appropriate: allergies, current medications, past family history, past medical history, past social history, past surgical history and problem list.  Review of Systems A comprehensive review of systems was negative.   Objective:    BP 106/63 (BP Location: Left Arm, Patient Position: Sitting, Cuff Size: Normal)   Pulse 77   Ht 5\' 6"  (1.676 m)   Wt 136 lb 1.6 oz (61.7 kg)   BMI 21.97 kg/m  Pelvic Exam: cervix normal in appearance, external genitalia normal and vagina normal without discharge. Pap smear obtained.   Assessment:    Screening pap smear.   Plan:    Follow up in 1 year, or as indicated by Pap results.     [name], MD [facility]

## 2017-01-27 LAB — IGP, COBASHPV16/18
HPV 16: NEGATIVE
HPV 18: NEGATIVE
HPV OTHER HR TYPES: NEGATIVE
PAP SMEAR COMMENT: 0

## 2017-02-06 ENCOUNTER — Encounter: Payer: Self-pay | Admitting: Family Medicine

## 2017-02-06 ENCOUNTER — Other Ambulatory Visit: Payer: Self-pay | Admitting: Family Medicine

## 2017-02-06 ENCOUNTER — Ambulatory Visit
Admission: RE | Admit: 2017-02-06 | Discharge: 2017-02-06 | Disposition: A | Discharge status: Home / Self Care | Payer: BC Managed Care – PPO | Source: Ambulatory Visit | Attending: Family Medicine | Admitting: Family Medicine

## 2017-02-06 DIAGNOSIS — M816 Localized osteoporosis [Lequesne]: Secondary | ICD-10-CM | POA: Insufficient documentation

## 2017-02-06 DIAGNOSIS — Z1231 Encounter for screening mammogram for malignant neoplasm of breast: Secondary | ICD-10-CM

## 2017-02-06 DIAGNOSIS — Z0000 Encounter for general adult medical examination without abnormal findings: Secondary | ICD-10-CM

## 2017-02-06 DIAGNOSIS — R928 Other abnormal and inconclusive findings on diagnostic imaging of breast: Secondary | ICD-10-CM

## 2017-02-06 NOTE — Progress Notes (Signed)
Orders Placed This Encounter  Procedures  . MM DIAG BREAST TOMO UNI RIGHT    Standing Status:   Future    Standing Expiration Date:   04/09/2018    Order Specific Question:   Reason for Exam (SYMPTOM  OR DIAGNOSIS REQUIRED)    Answer:   abnormal RIGHT breast mammo    Order Specific Question:   Is the patient pregnant?    Answer:   No    Order Specific Question:   Preferred imaging location?    Answer:   [facility]  . US BREAST LTD UNI RIGHT INC AXILLA    Standing Status:   Future    Standing Expiration Date:   04/09/2018    Order Specific Question:   Reason for Exam (SYMPTOM  OR DIAGNOSIS REQUIRED)    Answer:   abnormal RIGHT breast mammo    Order Specific Question:   Preferred imaging location?    Answer:   [facility]

## 2017-02-16 ENCOUNTER — Other Ambulatory Visit: Payer: Self-pay | Admitting: Family Medicine

## 2017-02-16 ENCOUNTER — Telehealth: Payer: Self-pay | Admitting: Family Medicine

## 2017-02-16 ENCOUNTER — Other Ambulatory Visit: Payer: Self-pay

## 2017-02-16 DIAGNOSIS — G43709 Chronic migraine without aura, not intractable, without status migrainosus: Secondary | ICD-10-CM

## 2017-02-16 NOTE — Telephone Encounter (Signed)
Called [facility] and they will give her the 2 additional refills

## 2017-02-16 NOTE — Telephone Encounter (Signed)
Patient should not need refills Caroline Harmon faxed a copy of the last Rx to [place] and even spoke to someone there I personally spoke with patient and she has NOT used 60 pills since September

## 2017-02-16 NOTE — Telephone Encounter (Signed)
Copied from [place] [id number]. Topic: Quick Communication - See Telephone Encounter >> Feb 16, 2017  1:24 PM Conception Chancy, NT wrote: CRM for notification. See Telephone encounter for:  02/16/17.   Patient husband is calling because they are trying to get relpax 40mg  refilled and pharmacy is stating it needs approval. Please advise and send it to the pharmacy on file. Thank you

## 2017-02-16 NOTE — Telephone Encounter (Signed)
Dr Sanda Klein spoke with patient, and she will call pharmacy because she should still have refills on file.

## 2017-02-16 NOTE — Telephone Encounter (Signed)
I would like to suggest to the patient that she consider the injectable medicine to help prevent migraines These are a newer class of medicines and are reported to be real game changers She can still use the eletriptan (Relpax) if needed for migraines, but I'd like to suggest she start the new medicine Back to me if she agrees Also, ask if she has truly used 10 pills and all 5 refills, that's 60 pills in just over 3 months (that is a LOT of medicine); if she is having migraines that frequently, then I definitely want her to be followed by a neurologist Please enter referral if she has used 50 pills or if she wants to see neuro

## 2017-02-22 ENCOUNTER — Ambulatory Visit
Admission: RE | Admit: 2017-02-22 | Discharge: 2017-02-22 | Disposition: A | Discharge status: Home / Self Care | Payer: BC Managed Care – PPO | Source: Ambulatory Visit | Attending: Family Medicine | Admitting: Family Medicine

## 2017-02-22 DIAGNOSIS — R928 Other abnormal and inconclusive findings on diagnostic imaging of breast: Secondary | ICD-10-CM

## 2017-04-13 ENCOUNTER — Other Ambulatory Visit: Payer: Self-pay

## 2017-04-13 DIAGNOSIS — G43709 Chronic migraine without aura, not intractable, without status migrainosus: Secondary | ICD-10-CM

## 2017-04-13 MED ORDER — ELETRIPTAN HYDROBROMIDE 40 MG PO TABS: ORAL_TABLET | ORAL | 5 refills | Status: DC

## 2017-04-13 NOTE — Telephone Encounter (Signed)
Copied from [place]. Topic: Inquiry >> Apr 13, 2017 12:44 PM Pricilla Handler wrote: Reason for CRM: [facility] (Phn # [phone]) called stating that they do not need a prescription for Eletriptan. A refill was one file.Marland KitchenMarland Kitchen

## 2017-11-06 ENCOUNTER — Ambulatory Visit: Payer: BC Managed Care – PPO | Admitting: Nurse Practitioner

## 2017-11-06 ENCOUNTER — Encounter: Payer: Self-pay | Admitting: Nurse Practitioner

## 2017-11-06 VITALS — BP 110/80 | HR 82 | Temp 98.3°F | Resp 16 | Ht 66.0 in | Wt 138.7 lb

## 2017-11-06 DIAGNOSIS — L700 Acne vulgaris: Secondary | ICD-10-CM | POA: Diagnosis not present

## 2017-11-06 NOTE — Progress Notes (Signed)
Name: Caroline Harmon   MRN: [id number]    DOB: Aug 10, 1960   Date:11/06/2017       Progress Note  Subjective  Chief Complaint  Chief Complaint  Patient presents with  . Breast Problem    HPI  Noticed lump on right breast about 10 day ago, significant other noticed it and was increasing in size, states it has since gone down. Mildly tender to palpation, tried squeeze it but was too painful. Doesn't hurt unless deep touch. Some redness.  no drainage, fevers, chills, never had this before.    Patient Active Problem List   Diagnosis Date Noted  . Abnormal mammogram of right breast 02/06/2017  . 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 Seborrheic keratosis 01/13/2016  . Cherry angioma 01/13/2016  . Abnormal appearance of cervix 11/18/2014  . Preventative health care 11/12/2014  . Screening for cervical cancer 11/12/2014  . Migraine   . Osteoporosis   . Benign tumor of carotid body     Past Medical History:  Diagnosis Date  . 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 Migraine   . Osteoporosis     Past Surgical History:  Procedure Laterality Date  . CAROTID BODY TUMOR EXCISION     benign  . COLONOSCOPY  10/06/13    Social History   Tobacco Use  . Smoking status: Never Smoker  . Smokeless tobacco: Never Used  Substance Use Topics  . Alcohol use: Yes    Comment: occassional     Current Outpatient Medications:  .  aspirin EC 81 MG tablet, Take 1 tablet (81 mg total) by mouth daily., Disp: , Rfl:  .  eletriptan (RELPAX) 40 MG tablet, Take 1 tablet (40 mg total) by mouth as needed for migraine or headache. May repeat in 2 hours if headache persists or recurs., Disp: 10 tablet, Rfl: 5  Allergies  Allergen Reactions  . Topamax [Topiramate] Other (See Comments)    Hematuria    ROS  No other specific complaints in a complete review of systems (except as listed in HPI above).  Objective  Vitals:   11/06/17 1526  BP:  110/80  Pulse: 82  Resp: 16  Temp: 98.3 F (36.8 C)  TempSrc: Oral  SpO2: 99%  Weight: 138 lb 11.2 oz (62.9 kg)  Height: 5\' 6"  (1.676 m)     Body mass index is 22.39 kg/m.  Nursing Note and Vital Signs reviewed.  Physical Exam  Pulmonary/Chest:         No results found for this or any previous visit (from the past 48 hour(s)).  Assessment & Plan  1. Closed comedone Noted to right breast, monitor, use heat if needed

## 2017-11-06 NOTE — Patient Instructions (Signed)
-   should continue to get smaller - can use warm compress - If worsening, getting bigger or redness increasing please come back.

## 2017-11-29 ENCOUNTER — Other Ambulatory Visit: Payer: Self-pay | Admitting: Family Medicine

## 2017-11-29 DIAGNOSIS — G43709 Chronic migraine without aura, not intractable, without status migrainosus: Secondary | ICD-10-CM

## 2017-12-13 ENCOUNTER — Ambulatory Visit (INDEPENDENT_AMBULATORY_CARE_PROVIDER_SITE_OTHER): Payer: BC Managed Care – PPO | Admitting: Family Medicine

## 2017-12-13 ENCOUNTER — Encounter: Payer: Self-pay | Admitting: Family Medicine

## 2017-12-13 VITALS — BP 118/72 | HR 86 | Temp 98.2°F | Ht 66.0 in | Wt 135.4 lb

## 2017-12-13 DIAGNOSIS — Z0000 Encounter for general adult medical examination without abnormal findings: Secondary | ICD-10-CM

## 2017-12-13 DIAGNOSIS — M816 Localized osteoporosis [Lequesne]: Secondary | ICD-10-CM

## 2017-12-13 DIAGNOSIS — Z789 Other specified health status: Secondary | ICD-10-CM | POA: Diagnosis not present

## 2017-12-13 NOTE — Progress Notes (Signed)
BP 118/72   Pulse 86   Temp 98.2 F (36.8 C) (Oral)   Ht _0  (1.676 m)   Wt 135 lb 6.4 oz (61.4 kg)   SpO2 98%   BMI 21.85 kg/m    Subjective:    Patient ID: Caroline Harmon, female    DOB: [birth year], 57 y.o.   MRN: [id number]  HPI: Caroline Harmon is a 56 y.o. female  Chief Complaint  Patient presents with  . Annual Exam    HPI Patient is here for her yearly CPE; no medical excitement since last visit  USPSTF grade A and B recommendations Depression:  Depression screen [facility] 2/9 12/13/2017 12/26/2016 08/21/2016 01/13/2016 12/07/2015  Decreased Interest 0 0 0 0 0  Down, Depressed, Hopeless 0 0 0 0 0  PHQ - 2 Score 0 0 0 0 0  Altered sleeping 0 - - - -  Tired, decreased energy 0 - - - -  Change in appetite 0 - - - -  Feeling bad or failure about yourself  0 - - - -  Trouble concentrating 0 - - - -  Moving slowly or fidgety/restless 0 - - - -  Suicidal thoughts 0 - - - -  PHQ-9 Score 0 - - - -  Difficult doing work/chores Not difficult at all - - - -   Hypertension: BP Readings from Last 3 Encounters:  12/13/17 118/72  11/06/17 110/80  01/24/17 106/63   Obesity: Wt Readings from Last 3 Encounters:  12/13/17 135 lb 6.4 oz (61.4 kg)  11/06/17 138 lb 11.2 oz (62.9 kg)  01/24/17 136 lb 1.6 oz (61.7 kg)   BMI Readings from Last 3 Encounters:  12/13/17 21.85 kg/m  11/06/17 22.39 kg/m  01/24/17 21.97 kg/m    Skin cancer: nothing worrisome; did see dermatologist last year and they removed a skin cancer; [place] removed and then larger area removed; no residual left Lung cancer:  never Breast cancer: last mammogram was Dec 2018; recommendation was bilateral screening mammogram in 1 year Colorectal cancer: 2015 last colonoscopy; no fam hx, ten year past Cervical cancer screening: pap smear with Dr. Marcelline Mates Jan 24, 2017; due in one year HIV, hep B, hep C: hep C screening no others needed STD testing and prevention (chl/gon/syphilis): no others needed Intimate  partner violence: no abuse Contraception: n/a Osteoporosis: discussed, refer to endo; reviewed last note Fall prevention/vitamin D: discussed, fortified OJ, yogurt, cottage cheese; taking calcium Immunizations: does not want flu shot; shingrix discussed; tetanus UTD Diet: good eater Exercise: room for improvement; does walk some  Alcohol:    Office Visit from 12/13/2017 in [facility]  AUDIT-C Score  3     Tobacco use: never smoker AAA: n/a Aspirin: taking 81 mg aspirin Glucose:  Glucose, Bld  Date Value Ref Range Status  12/13/2017 88 65 - 139 mg/dL Final    Comment:    .        Non-fasting reference interval .   12/26/2016 81 65 - 99 mg/dL Final    Comment:    .            Fasting reference interval .   12/07/2015 84 65 - 99 mg/dL Final   Lipids:  Lab Results  Component Value Date   CHOL 175 12/13/2017   CHOL 194 12/26/2016   CHOL 184 12/07/2015   Lab Results  Component Value Date   HDL 82 12/13/2017   HDL 94 12/26/2016   HDL 93 12/07/2015  Lab Results  Component Value Date   LDLCALC 78 12/13/2017   LDLCALC 85 12/26/2016   LDLCALC 82 12/07/2015   Lab Results  Component Value Date   TRIG 73 12/13/2017   TRIG 60 12/26/2016   TRIG 47 12/07/2015   Lab Results  Component Value Date   CHOLHDL 2.1 12/13/2017   CHOLHDL 2.1 12/26/2016   CHOLHDL 2.0 12/07/2015   No results found for: LDLDIRECT   Depression screen [facility] 2/9 12/13/2017 12/26/2016 08/21/2016 01/13/2016 12/07/2015  Decreased Interest 0 0 0 0 0  Down, Depressed, Hopeless 0 0 0 0 0  PHQ - 2 Score 0 0 0 0 0  Altered sleeping 0 - - - -  Tired, decreased energy 0 - - - -  Change in appetite 0 - - - -  Feeling bad or failure about yourself  0 - - - -  Trouble concentrating 0 - - - -  Moving slowly or fidgety/restless 0 - - - -  Suicidal thoughts 0 - - - -  PHQ-9 Score 0 - - - -  Difficult doing work/chores Not difficult at all - - - -   Fall Risk  12/13/2017 11/06/2017  12/26/2016 08/21/2016 01/13/2016  Falls in the past year? _0     Relevant past medical, surgical, family and social history reviewed Past Medical History:  Diagnosis Date  . 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 Migraine   . Osteoporosis    Past Surgical History:  Procedure Laterality Date  . CAROTID BODY TUMOR EXCISION     benign  . COLONOSCOPY  10/06/13   Family History  Problem Relation Age of Onset  . Migraines Mother   . Heart attack Father   . Cancer Father        skin  . Hypertension Father    Social History   Tobacco Use  . Smoking status: Never Smoker  . Smokeless tobacco: Never Used  Substance Use Topics  . Alcohol use: Yes    Comment: occassional  . Drug use: No     Office Visit from 12/13/2017 in [facility]  AUDIT-C Score  3      Interim medical history since last visit reviewed. Allergies and medications reviewed  Review of Systems  Constitutional: Negative for unexpected weight change.  Respiratory: Negative for shortness of breath.   Cardiovascular: Negative for chest pain.  Gastrointestinal: Negative for blood in stool.  Genitourinary: Negative for hematuria.   Per HPI unless specifically indicated above     Objective:    BP 118/72   Pulse 86   Temp 98.2 F (36.8 C) (Oral)   Ht _1  (1.676 m)   Wt 135 lb 6.4 oz (61.4 kg)   SpO2 98%   BMI 21.85 kg/m   Wt Readings from Last 3 Encounters:  12/13/17 135 lb 6.4 oz (61.4 kg)  11/06/17 138 lb 11.2 oz (62.9 kg)  01/24/17 136 lb 1.6 oz (61.7 kg)    Physical Exam  Constitutional: She appears well-developed and well-nourished.  HENT:  Head: Normocephalic and atraumatic.  Right Ear: Hearing, tympanic membrane, external ear and ear canal normal.  Left Ear: Hearing, tympanic membrane, external ear and ear canal normal.  Eyes: Conjunctivae and EOM are normal. Right eye exhibits no hordeolum. Left eye exhibits no hordeolum. No scleral  icterus.  Neck: Carotid bruit is not present. No thyromegaly present.  Cardiovascular: Normal rate, regular rhythm, S1  normal, S2 normal and normal heart sounds.  No extrasystoles are present.  Pulmonary/Chest: Effort normal and breath sounds normal. No respiratory distress.  Abdominal: Soft. Normal appearance and bowel sounds are normal. She exhibits no distension, no abdominal bruit, no pulsatile midline mass and no mass. There is no hepatosplenomegaly. There is no tenderness. No hernia.  Musculoskeletal: Normal range of motion. She exhibits no edema.  Lymphadenopathy:       Head (right side): No submandibular adenopathy present.       Head (left side): No submandibular adenopathy present.    She has no cervical adenopathy.    She has no axillary adenopathy.  Neurological: She is alert. She displays no tremor. No cranial nerve deficit. She exhibits normal muscle tone. Gait normal.  Reflex Scores:      Patellar reflexes are 2+ on the right side and 2+ on the left side. Skin: Skin is warm and dry. No bruising and no ecchymosis noted. No cyanosis. No pallor.  Psychiatric: Her speech is normal and behavior is normal. Thought content normal. Her mood appears not anxious. She does not exhibit a depressed mood.      Assessment & Plan:   Problem List Items Addressed This Visit      Musculoskeletal and Integument   Osteoporosis    Reviewed her last DEXA with her; discussed treatment options; will refer to endo; calcium, vit D, fall precauations      Relevant Orders   Ambulatory referral to Endocrinology   VITAMIN D 25 Hydroxy (Vit-D Deficiency, Fractures)     Other   Preventative health care - Primary    USPSTF grade A and B recommendations reviewed with patient; age-appropriate recommendations, preventive care, screening tests, etc discussed and encouraged; healthy living encouraged; see AVS for patient education given to patient       Relevant Orders   COMPLETE METABOLIC PANEL WITH  GFR (Completed)   CBC with Differential/Platelet (Completed)   Lipid panel (Completed)   TSH (Completed)    Other Visit Diagnoses    Measles, mumps, rubella (MMR) vaccination status unknown       Relevant Orders   Measles/Mumps/Rubella Immunity (Completed)       Follow up plan: Return in about 1 year (around 12/14/2018) for complete physical.  An after-visit summary was printed and given to the patient at [place].  Please see the patient instructions which may contain other information and recommendations beyond what is mentioned above in the assessment and plan.  No orders of the defined types were placed in this encounter.   Orders Placed This Encounter  Procedures  . Measles/Mumps/Rubella Immunity  . COMPLETE METABOLIC PANEL WITH GFR  . CBC with Differential/Platelet  . Lipid panel  . TSH  . VITAMIN D 25 Hydroxy (Vit-D Deficiency, Fractures)  . VITAMIN D 25 Hydroxy (Vit-D Deficiency, Fractures)  . Ambulatory referral to Endocrinology

## 2017-12-13 NOTE — Patient Instructions (Addendum)
Consider getting the new shingles vaccine called Shingrix; that is available for individuals 59 years of age and older, and is recommended even if you have had shingles in the past and/or already received the old shingles vaccine (Zostavax); it is a two-part series, and is available at many local pharmacies  Please do see Dr. Marcelline Mates for your next pap smear We'll have you see the endocrinologist about your bones We'll get labs today If you have not heard anything from my staff in a week about any orders/referrals/studies from today, please contact us here to follow-up [phone]  Health Maintenance, Female Adopting a healthy lifestyle and getting preventive care can go a long way to promote health and wellness. Talk with your health care provider about what schedule of regular examinations is right for you. This is a good chance for you to check in with your provider about disease prevention and staying healthy. In between checkups, there are plenty of things you can do on your own. Experts have done a lot of research about which lifestyle changes and preventive measures are most likely to keep you healthy. Ask your health care provider for more information. Weight and diet Eat a healthy diet  Be sure to include plenty of vegetables, fruits, low-fat dairy products, and lean protein.  Do not eat a lot of foods high in solid fats, added sugars, or salt.  Get regular exercise. This is one of the most important things you can do for your health. ? Most adults should exercise for at least 150 minutes each week. The exercise should increase your heart rate and make you sweat (moderate-intensity exercise). ? Most adults should also do strengthening exercises at least twice a week. This is in addition to the moderate-intensity exercise.  Maintain a healthy weight  Body mass index (BMI) is a measurement that can be used to identify possible weight problems. It estimates body fat based on height and  weight. Your health care provider can help determine your BMI and help you achieve or maintain a healthy weight.  For females 22 years of age and older: ? A BMI below 18.5 is considered underweight. ? A BMI of 18.5 to 24.9 is normal. ? A BMI of 25 to 29.9 is considered overweight. ? A BMI of 30 and above is considered obese.  Watch levels of cholesterol and blood lipids  You should start having your blood tested for lipids and cholesterol at 59 years of age, then have this test every 5 years.  You may need to have your cholesterol levels checked more often if: ? Your lipid or cholesterol levels are high. ? You are older than 59 years of age. ? You are at high risk for heart disease.  Cancer screening Lung Cancer  Lung cancer screening is recommended for adults 28-82 years old who are at high risk for lung cancer because of a history of smoking.  A yearly low-dose CT scan of the lungs is recommended for people who: ? Currently smoke. ? Have quit within the past 15 years. ? Have at least a 30-pack-year history of smoking. A pack year is smoking an average of one pack of cigarettes a day for 1 year.  Yearly screening should continue until it has been 15 years since you quit.  Yearly screening should stop if you develop a health problem that would prevent you from having lung cancer treatment.  Breast Cancer  Practice breast self-awareness. This means understanding how your breasts normally appear and  feel.  It also means doing regular breast self-exams. Let your health care provider know about any changes, no matter how small.  If you are in your 20s or 30s, you should have a clinical breast exam (CBE) by a health care provider every 1-3 years as part of a regular health exam.  If you are 78 or older, have a CBE every year. Also consider having a breast X-ray (mammogram) every year.  If you have a family history of breast cancer, talk to your health care provider about genetic  screening.  If you are at high risk for breast cancer, talk to your health care provider about having an MRI and a mammogram every year.  Breast cancer gene (BRCA) assessment is recommended for women who have family members with BRCA-related cancers. BRCA-related cancers include: ? Breast. ? Ovarian. ? Tubal. ? Peritoneal cancers.  Results of the assessment will determine the need for genetic counseling and BRCA1 and BRCA2 testing.  Cervical Cancer Your health care provider may recommend that you be screened regularly for cancer of the pelvic organs (ovaries, uterus, and vagina). This screening involves a pelvic examination, including checking for microscopic changes to the surface of your cervix (Pap test). You may be encouraged to have this screening done every 3 years, beginning at age 13.  For women ages 50-65, health care providers may recommend pelvic exams and Pap testing every 3 years, or they may recommend the Pap and pelvic exam, combined with testing for human papilloma virus (HPV), every 5 years. Some types of HPV increase your risk of cervical cancer. Testing for HPV may also be done on women of any age with unclear Pap test results.  Other health care providers may not recommend any screening for nonpregnant women who are considered low risk for pelvic cancer and who do not have symptoms. Ask your health care provider if a screening pelvic exam is right for you.  If you have had past treatment for cervical cancer or a condition that could lead to cancer, you need Pap tests and screening for cancer for at least 20 years after your treatment. If Pap tests have been discontinued, your risk factors (such as having a new sexual partner) need to be reassessed to determine if screening should resume. Some women have medical problems that increase the chance of getting cervical cancer. In these cases, your health care provider may recommend more frequent screening and Pap  tests.  Colorectal Cancer  This type of cancer can be detected and often prevented.  Routine colorectal cancer screening usually begins at 59 years of age and continues through 59 years of age.  Your health care provider may recommend screening at an earlier age if you have risk factors for colon cancer.  Your health care provider may also recommend using home test kits to check for hidden blood in the stool.  A small camera at the end of a tube can be used to examine your colon directly (sigmoidoscopy or colonoscopy). This is done to check for the earliest forms of colorectal cancer.  Routine screening usually begins at age 46.  Direct examination of the colon should be repeated every 5-10 years through 59 years of age. However, you may need to be screened more often if early forms of precancerous polyps or small growths are found.  Skin Cancer  Check your skin from head to toe regularly.  Tell your health care provider about any new moles or changes in moles, especially if there  is a change in a mole's shape or color.  Also tell your health care provider if you have a mole that is larger than the size of a pencil eraser.  Always use sunscreen. Apply sunscreen liberally and repeatedly throughout the day.  Protect yourself by wearing long sleeves, pants, a wide-brimmed hat, and sunglasses whenever you are outside.  Heart disease, diabetes, and high blood pressure  High blood pressure causes heart disease and increases the risk of stroke. High blood pressure is more likely to develop in: ? People who have blood pressure in the high end of the normal range (130-139/85-89 mm Hg). ? People who are overweight or obese. ? People who are African American.  If you are 52-32 years of age, have your blood pressure checked every 3-5 years. If you are 91 years of age or older, have your blood pressure checked every year. You should have your blood pressure measured twice-once when you are at  a hospital or clinic, and once when you are not at a hospital or clinic. Record the average of the two measurements. To check your blood pressure when you are not at a hospital or clinic, you can use: ? An automated blood pressure machine at a pharmacy. ? A home blood pressure monitor.  If you are between 59 years and 39 years old, ask your health care provider if you should take aspirin to prevent strokes.  Have regular diabetes screenings. This involves taking a blood sample to check your fasting blood sugar level. ? If you are at a normal weight and have a low risk for diabetes, have this test once every three years after 59 years of age. ? If you are overweight and have a high risk for diabetes, consider being tested at a younger age or more often. Preventing infection Hepatitis B  If you have a higher risk for hepatitis B, you should be screened for this virus. You are considered at high risk for hepatitis B if: ? You were born in a country where hepatitis B is common. Ask your health care provider which countries are considered high risk. ? Your parents were born in a high-risk country, and you have not been immunized against hepatitis B (hepatitis B vaccine). ? You have HIV or AIDS. ? You use needles to inject street drugs. ? You live with someone who has hepatitis B. ? You have had sex with someone who has hepatitis B. ? You get hemodialysis treatment. ? You take certain medicines for conditions, including cancer, organ transplantation, and autoimmune conditions.  Hepatitis C  Blood testing is recommended for: ? Everyone born from [birth year] through 1967. ? Anyone with known risk factors for hepatitis C.  Sexually transmitted infections (STIs)  You should be screened for sexually transmitted infections (STIs) including gonorrhea and chlamydia if: ? You are sexually active and are younger than 59 years of age. ? You are older than 59 years of age and your health care provider tells  you that you are at risk for this type of infection. ? Your sexual activity has changed since you were last screened and you are at an increased risk for chlamydia or gonorrhea. Ask your health care provider if you are at risk.  If you do not have HIV, but are at risk, it may be recommended that you take a prescription medicine daily to prevent HIV infection. This is called pre-exposure prophylaxis (PrEP). You are considered at risk if: ? You are sexually active and do  not regularly use condoms or know the HIV status of your partner(s). ? You take drugs by injection. ? You are sexually active with a partner who has HIV.  Talk with your health care provider about whether you are at high risk of being infected with HIV. If you choose to begin PrEP, you should first be tested for HIV. You should then be tested every 3 months for as long as you are taking PrEP. Pregnancy  If you are premenopausal and you may become pregnant, ask your health care provider about preconception counseling.  If you may become pregnant, take 400 to 800 micrograms (mcg) of folic acid every day.  If you want to prevent pregnancy, talk to your health care provider about birth control (contraception). Osteoporosis and menopause  Osteoporosis is a disease in which the bones lose minerals and strength with aging. This can result in serious bone fractures. Your risk for osteoporosis can be identified using a bone density scan.  If you are 1 years of age or older, or if you are at risk for osteoporosis and fractures, ask your health care provider if you should be screened.  Ask your health care provider whether you should take a calcium or vitamin D supplement to lower your risk for osteoporosis.  Menopause may have certain physical symptoms and risks.  Hormone replacement therapy may reduce some of these symptoms and risks. Talk to your health care provider about whether hormone replacement therapy is right for  you. Follow these instructions at home:  Schedule regular health, dental, and eye exams.  Stay current with your immunizations.  Do not use any tobacco products including cigarettes, chewing tobacco, or electronic cigarettes.  If you are pregnant, do not drink alcohol.  If you are breastfeeding, limit how much and how often you drink alcohol.  Limit alcohol intake to no more than 1 drink per day for nonpregnant women. One drink equals 12 ounces of beer, 5 ounces of wine, or 1 ounces of hard liquor.  Do not use street drugs.  Do not share needles.  Ask your health care provider for help if you need support or information about quitting drugs.  Tell your health care provider if you often feel depressed.  Tell your health care provider if you have ever been abused or do not feel safe at home. This information is not intended to replace advice given to you by your health care provider. Make sure you discuss any questions you have with your health care provider. Document Released: 08/29/2010 Document Revised: 07/22/2015 Document Reviewed: 11/17/2014 Elsevier Interactive Patient Education  [employer].

## 2017-12-14 LAB — COMPLETE METABOLIC PANEL WITH GFR
AG Ratio: 1.8 (calc) (ref 1.0–2.5)
ALT: 11 U/L (ref 6–29)
AST: 18 U/L (ref 10–35)
Albumin: 4.4 g/dL (ref 3.6–5.1)
Alkaline phosphatase (APISO): 63 U/L (ref 33–130)
BUN: 19 mg/dL (ref 7–25)
CHLORIDE: 101 mmol/L (ref 98–110)
CO2: 29 mmol/L (ref 20–32)
Calcium: 9.3 mg/dL (ref 8.6–10.4)
Creat: 0.8 mg/dL (ref 0.50–1.05)
GFR, EST AFRICAN AMERICAN: 94 mL/min/{1.73_m2} (ref >=60)
GFR, Est Non African American: 81 mL/min/{1.73_m2} (ref >=60)
Globulin: 2.4 g/dL (calc) (ref 1.9–3.7)
Glucose, Bld: 88 mg/dL (ref 65–139)
Potassium: 4.1 mmol/L (ref 3.5–5.3)
Sodium: 138 mmol/L (ref 135–146)
TOTAL PROTEIN: 6.8 g/dL (ref 6.1–8.1)
Total Bilirubin: 0.5 mg/dL (ref 0.2–1.2)

## 2017-12-14 LAB — CBC WITH DIFFERENTIAL/PLATELET
BASOS ABS: 50 {cells}/uL (ref 0–200)
Basophils Relative: 0.8 %
EOS PCT: 2.6 %
Eosinophils Absolute: 161 cells/uL (ref 15–500)
HCT: 36.5 % (ref 35.0–45.0)
HEMOGLOBIN: 12.5 g/dL (ref 11.7–15.5)
Lymphs Abs: 1668 cells/uL (ref 850–3900)
MCH: 31.2 pg (ref 27.0–33.0)
MCHC: 34.2 g/dL (ref 32.0–36.0)
MCV: 91 fL (ref 80.0–100.0)
MPV: 9.6 fL (ref 7.5–12.5)
Monocytes Relative: 6.6 %
NEUTROS ABS: 3912 {cells}/uL (ref 1500–7800)
NEUTROS PCT: 63.1 %
PLATELETS: 310 10*3/uL (ref 140–400)
RBC: 4.01 10*6/uL (ref 3.80–5.10)
RDW: 11.6 % (ref 11.0–15.0)
TOTAL LYMPHOCYTE: 26.9 %
WBC mixed population: 409 cells/uL (ref 200–950)
WBC: 6.2 10*3/uL (ref 3.8–10.8)

## 2017-12-14 LAB — LIPID PANEL
CHOLESTEROL: 175 mg/dL (ref <200)
HDL: 82 mg/dL (ref >50)
LDL Cholesterol (Calc): 78 mg/dL (calc)
Non-HDL Cholesterol (Calc): 93 mg/dL (calc) (ref <130)
Total CHOL/HDL Ratio: 2.1 (calc) (ref <5.0)
Triglycerides: 73 mg/dL (ref <150)

## 2017-12-14 LAB — TSH: TSH: 1.76 mIU/L (ref 0.40–4.50)

## 2017-12-14 LAB — MEASLES/MUMPS/RUBELLA IMMUNITY
MUMPS IGG: 73.1 [AU]/ml
RUBELLA: 10.8 {index}
RUBEOLA IGG: 83.1 [AU]/ml

## 2017-12-14 LAB — VITAMIN D 25 HYDROXY (VIT D DEFICIENCY, FRACTURES): Vit D, 25-Hydroxy: 31 ng/mL (ref 30–100)

## 2017-12-17 NOTE — Assessment & Plan Note (Signed)
USPSTF grade A and B recommendations reviewed with patient; age-appropriate recommendations, preventive care, screening tests, etc discussed and encouraged; healthy living encouraged; see AVS for patient education given to patient  

## 2017-12-17 NOTE — Assessment & Plan Note (Signed)
Reviewed her last DEXA with her; discussed treatment options; will refer to endo; calcium, vit D, fall precauations

## 2018-01-07 ENCOUNTER — Other Ambulatory Visit: Payer: Self-pay | Admitting: Family Medicine

## 2018-01-07 DIAGNOSIS — G43709 Chronic migraine without aura, not intractable, without status migrainosus: Secondary | ICD-10-CM

## 2018-01-28 ENCOUNTER — Other Ambulatory Visit: Payer: Self-pay | Admitting: Family Medicine

## 2018-01-28 DIAGNOSIS — G43709 Chronic migraine without aura, not intractable, without status migrainosus: Secondary | ICD-10-CM

## 2018-01-30 ENCOUNTER — Encounter: Payer: Self-pay | Admitting: Obstetrics and Gynecology

## 2018-01-30 ENCOUNTER — Ambulatory Visit (INDEPENDENT_AMBULATORY_CARE_PROVIDER_SITE_OTHER): Payer: BC Managed Care – PPO | Admitting: Obstetrics and Gynecology

## 2018-01-30 VITALS — BP 98/64 | HR 84 | Ht 66.0 in | Wt 137.0 lb

## 2018-01-30 DIAGNOSIS — M810 Age-related osteoporosis without current pathological fracture: Secondary | ICD-10-CM | POA: Diagnosis not present

## 2018-01-30 DIAGNOSIS — D229 Melanocytic nevi, unspecified: Secondary | ICD-10-CM | POA: Diagnosis not present

## 2018-01-30 DIAGNOSIS — Z01419 Encounter for gynecological examination (general) (routine) without abnormal findings: Secondary | ICD-10-CM

## 2018-01-30 DIAGNOSIS — Z1239 Encounter for other screening for malignant neoplasm of breast: Secondary | ICD-10-CM | POA: Diagnosis not present

## 2018-01-30 NOTE — Progress Notes (Signed)
ANNUAL PREVENTATIVE CARE GYNECOLOGY  ENCOUNTER NOTE  Subjective:       Caroline Harmon is a 57 y.o. G70P2002 female here for a routine annual gynecologic exam. The patient is sexually active. The patient has never been taking hormone replacement therapy. Patient denies post-menopausal vaginal bleeding. The patient wears seatbelts: yes. The patient participates in regular exercise: yes (walking occasionally). Has the patient ever been transfused or tattooed?: no. The patient reports that there is not domestic violence in her life.  Current complaints: 1.  None    Gynecologic History No LMP recorded. Patient is postmenopausal. Contraception: post menopausal status Last Pap: 12/2016. Results were: ASCUS with negative HPV. Prior history of abnormal pap smear in 2015 (NILM but + HR HPV) Last mammogram: 02/22/2017. Results were: normal Last Colonoscopy: 2015.  Was normal. Repeat in 10 years.  Last Dexa Scan: 01/2017.  Osteoporosis.  T score is - 2.8. For repeat in 2 years.    Obstetric History OB History  Gravida Para Term Preterm AB Living  2 2 2     2   SAB TAB Ectopic Multiple Live Births          2    # Outcome Date GA Lbr Len/2nd Weight Sex Delivery Anes PTL Lv  2 Term 05/14/89    M Vag-Spont   LIV  1 Term 05/29/87    F Vag-Spont   LIV    Past Medical History:  Diagnosis Date  . 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 Migraine   . Osteoporosis     Family History  Problem Relation Age of Onset  . Migraines Mother   . Heart attack Father   . Cancer Father        skin  . Hypertension Father     Past Surgical History:  Procedure Laterality Date  . CAROTID BODY TUMOR EXCISION     benign  . COLONOSCOPY  10/06/13    Social History   Socioeconomic History  . Marital status: Married    Spouse name: Gershon Mussel  . Number of children: 1  . Years of education: 78  . Highest education level: Bachelor's degree (e.g., BA, AB, BS)  Occupational History  .  Not on file  Social Needs  . Financial resource strain: Not hard at all  . Food insecurity:    Worry: Never true    Inability: Never true  . Transportation needs:    Medical: No    Non-medical: No  Tobacco Use  . Smoking status: Never Smoker  . Smokeless tobacco: Never Used  Substance and Sexual Activity  . Alcohol use: Yes    Comment: occassional  . Drug use: No  . Sexual activity: Yes    Birth control/protection: None, Post-menopausal  Lifestyle  . Physical activity:    Days per week: 2 days    Minutes per session: 30 min  . Stress: Not at all  Relationships  . Social connections:    Talks on phone: Three times a week    Gets together: Three times a week    Attends religious service: More than 4 times per year    Active member of club or organization: No    Attends meetings of clubs or organizations: Never    Relationship status: Married  . Intimate partner violence:    Fear of current or ex partner: No    Emotionally abused: No    Physically abused: No    Forced  sexual activity: No  Other Topics Concern  . Not on file  Social History Narrative  . Not on file    Current Outpatient Medications on File Prior to Visit  Medication Sig Dispense Refill  . aspirin EC 81 MG tablet Take 1 tablet (81 mg total) by mouth daily.    Marland Kitchen eletriptan (RELPAX) 40 MG tablet Take 1 tablet (40 mg total) by mouth as needed for migraine or headache. May repeat in 2 hours if headache persists or recurs. 10 tablet 0   No current facility-administered medications on file prior to visit.     Allergies  Allergen Reactions  . Topamax [Topiramate] Other (See Comments)    Hematuria      Review of Systems ROS Review of Systems - General ROS: negative for - chills, fatigue, fever, hot flashes, night sweats, weight gain or weight loss Psychological ROS: negative for - anxiety, decreased libido, depression, mood swings, physical abuse or sexual abuse Ophthalmic ROS: negative for - blurry  vision, eye pain or loss of vision ENT ROS: negative for - headaches, hearing change, visual changes or vocal changes Allergy and Immunology ROS: negative for - hives, itchy/watery eyes or seasonal allergies Hematological and Lymphatic ROS: negative for - bleeding problems, bruising, swollen lymph nodes or weight loss Endocrine ROS: negative for - galactorrhea, hair pattern changes, hot flashes, malaise/lethargy, mood swings, palpitations, polydipsia/polyuria, skin changes, temperature intolerance or unexpected weight changes Breast ROS: negative for - new or changing breast lumps or nipple discharge Respiratory ROS: negative for - cough or shortness of breath Cardiovascular ROS: negative for - chest pain, irregular heartbeat, palpitations or shortness of breath Gastrointestinal ROS: no abdominal pain, change in bowel habits, or black or bloody stools Genito-Urinary ROS: no dysuria, trouble voiding, or hematuria Musculoskeletal ROS: negative for - joint pain or joint stiffness Neurological ROS: negative for - bowel and bladder control changes Dermatological ROS: negative for rash and skin lesion changes   Objective:   BP 98/64   Pulse 84   Ht 5\' 6"  (1.676 m)   Wt 137 lb (62.1 kg)   BMI 22.11 kg/m  CONSTITUTIONAL: Well-developed, well-nourished female in no acute distress.  PSYCHIATRIC: Normal mood and affect. Normal behavior. Normal judgment and thought content. [place]: Alert and oriented to person, place, and time. Normal muscle tone coordination. No cranial nerve deficit noted. HENT:  Normocephalic, atraumatic, External right and left ear normal. Oropharynx is clear and moist EYES: Conjunctivae and EOM are normal. Pupils are equal, round, and reactive to light. No scleral icterus.  NECK: Normal range of motion, supple, no masses.  Normal thyroid.  SKIN: Skin is warm and dry. No rash noted. Not diaphoretic. No erythema. No pallor. Multiple benign-appearing small skin moles in  generalized distribution along chest and trunk. Also with few Lakeyn Dokken angiomas.  CARDIOVASCULAR: Normal heart rate noted, regular rhythm, no murmur. RESPIRATORY: Clear to auscultation bilaterally. Effort and breath sounds normal, no problems with respiration noted. BREASTS: Symmetric in size. No masses, skin changes, nipple drainage, or lymphadenopathy. ABDOMEN: Soft, normal bowel sounds, no distention noted.  No tenderness, rebound or guarding.  BLADDER: Normal PELVIC:  Bladder no bladder distension noted  Urethra: normal appearing urethra with no masses, tenderness or lesions  Vulva: normal appearing vulva with no masses, tenderness or lesions  Vagina: normal appearing vagina with no discharge, no lesions. Mild vaginal atrophy.   Cervix: normal appearing cervix without discharge or lesions  Uterus: uterus is normal size, shape, consistency and nontender  Adnexa: normal  adnexa in size, nontender and no masses  RV: External Exam NormaI, No Rectal Masses and Normal Sphincter tone  MUSCULOSKELETAL: Normal range of motion. No tenderness.  No cyanosis, clubbing, or edema.  2+ distal pulses. LYMPHATIC: No Axillary, Supraclavicular, or Inguinal Adenopathy.   Labs: Lab Results  Component Value Date   WBC 6.2 12/13/2017   HGB 12.5 12/13/2017   HCT 36.5 12/13/2017   MCV 91.0 12/13/2017   PLT 310 12/13/2017    Lab Results  Component Value Date   CREATININE 0.80 12/13/2017   BUN 19 12/13/2017   NA 138 12/13/2017   K 4.1 12/13/2017   CL 101 12/13/2017   CO2 29 12/13/2017    Lab Results  Component Value Date   ALT 11 12/13/2017   AST 18 12/13/2017   ALKPHOS 69 12/07/2015   BILITOT 0.5 12/13/2017    Lab Results  Component Value Date   CHOL 175 12/13/2017   HDL 82 12/13/2017   LDLCALC 78 12/13/2017   TRIG 73 12/13/2017   CHOLHDL 2.1 12/13/2017    Lab Results  Component Value Date   TSH 1.76 12/13/2017    No results found for: HGBA1C   Assessment:   Annual gynecologic  examination 57 y.o. Normal BMI Osteoporosis Skin moles  Plan:  Pap: up to date. H/o last pap ASCUS but negative HPV. Can continue routine q 3 year screens with negative HPV testing. Next pap due in 2021.  Mammogram: scheduled for later this month.  Stool Guaiac Testing:  Not Ordered Labs: reviewed. Previously ordered by PCP.  Routine preventative health maintenance measures emphasized: Exercise/Diet/Weight control, Alcohol/Substance use risks and Stress Management Has appointment to discuss treatment options for osteoporosis next week. Currently on Vitamin D and calcium.  Has appt with Dermatologist (routine follow up) for moles in January.  Declines flu vaccine.   Return to [place], MD  [facility]

## 2018-01-30 NOTE — Progress Notes (Signed)
PT is present today for her annual exam. Pt stated that she has been doing self-breast exams monthly. Pt stated that she is doing well and denies any issues. No problems or concerns.     

## 2018-01-30 NOTE — Patient Instructions (Signed)
Health Maintenance for Postmenopausal Women Menopause is a normal process in which your reproductive ability comes to an end. This process happens gradually over a span of months to years, usually between the ages of 22 and 9. Menopause is complete when you have missed 12 consecutive menstrual periods. It is important to talk with your health care provider about some of the most common conditions that affect postmenopausal women, such as heart disease, cancer, and bone loss (osteoporosis). Adopting a healthy lifestyle and getting preventive care can help to promote your health and wellness. Those actions can also lower your chances of developing some of these common conditions. What should I know about menopause? During menopause, you may experience a number of symptoms, such as:  Moderate-to-severe hot flashes.  Night sweats.  Decrease in sex drive.  Mood swings.  Headaches.  Tiredness.  Irritability.  Memory problems.  Insomnia.  Choosing to treat or not to treat menopausal changes is an individual decision that you make with your health care provider. What should I know about hormone replacement therapy and supplements? Hormone therapy products are effective for treating symptoms that are associated with menopause, such as hot flashes and night sweats. Hormone replacement carries certain risks, especially as you become older. If you are thinking about using estrogen or estrogen with progestin treatments, discuss the benefits and risks with your health care provider. What should I know about heart disease and stroke? Heart disease, heart attack, and stroke become more likely as you age. This may be due, in part, to the hormonal changes that your body experiences during menopause. These can affect how your body processes dietary fats, triglycerides, and cholesterol. Heart attack and stroke are both medical emergencies. There are many things that you can do to help prevent heart disease  and stroke:  Have your blood pressure checked at least every 1-2 years. High blood pressure causes heart disease and increases the risk of stroke.  If you are 53-22 years old, ask your health care provider if you should take aspirin to prevent a heart attack or a stroke.  Do not use any tobacco products, including cigarettes, chewing tobacco, or electronic cigarettes. If you need help quitting, ask your health care provider.  It is important to eat a healthy diet and maintain a healthy weight. ? Be sure to include plenty of vegetables, fruits, low-fat dairy products, and lean protein. ? Avoid eating foods that are high in solid fats, added sugars, or salt (sodium).  Get regular exercise. This is one of the most important things that you can do for your health. ? Try to exercise for at least 150 minutes each week. The type of exercise that you do should increase your heart rate and make you sweat. This is known as moderate-intensity exercise. ? Try to do strengthening exercises at least twice each week. Do these in addition to the moderate-intensity exercise.  Know your numbers.Ask your health care provider to check your cholesterol and your blood glucose. Continue to have your blood tested as directed by your health care provider.  What should I know about cancer screening? There are several types of cancer. Take the following steps to reduce your risk and to catch any cancer development as early as possible. Breast Cancer  Practice breast self-awareness. ? This means understanding how your breasts normally appear and feel. ? It also means doing regular breast self-exams. Let your health care provider know about any changes, no matter how small.  If you are 40  or older, have a clinician do a breast exam (clinical breast exam or CBE) every year. Depending on your age, family history, and medical history, it may be recommended that you also have a yearly breast X-ray (mammogram).  If you  have a family history of breast cancer, talk with your health care provider about genetic screening.  If you are at high risk for breast cancer, talk with your health care provider about having an MRI and a mammogram every year.  Breast cancer (BRCA) gene test is recommended for women who have family members with BRCA-related cancers. Results of the assessment will determine the need for genetic counseling and BRCA1 and for BRCA2 testing. BRCA-related cancers include these types: ? Breast. This occurs in males or females. ? Ovarian. ? Tubal. This may also be called fallopian tube cancer. ? Cancer of the abdominal or pelvic lining (peritoneal cancer). ? Prostate. ? Pancreatic.  Cervical, Uterine, and Ovarian Cancer Your health care provider may recommend that you be screened regularly for cancer of the pelvic organs. These include your ovaries, uterus, and vagina. This screening involves a pelvic exam, which includes checking for microscopic changes to the surface of your cervix (Pap test).  For women ages 21-65, health care providers may recommend a pelvic exam and a Pap test every three years. For women ages 79-65, they may recommend the Pap test and pelvic exam, combined with testing for human papilloma virus (HPV), every five years. Some types of HPV increase your risk of cervical cancer. Testing for HPV may also be done on women of any age who have unclear Pap test results.  Other health care providers may not recommend any screening for nonpregnant women who are considered low risk for pelvic cancer and have no symptoms. Ask your health care provider if a screening pelvic exam is right for you.  If you have had past treatment for cervical cancer or a condition that could lead to cancer, you need Pap tests and screening for cancer for at least 20 years after your treatment. If Pap tests have been discontinued for you, your risk factors (such as having a new sexual partner) need to be  reassessed to determine if you should start having screenings again. Some women have medical problems that increase the chance of getting cervical cancer. In these cases, your health care provider may recommend that you have screening and Pap tests more often.  If you have a family history of uterine cancer or ovarian cancer, talk with your health care provider about genetic screening.  If you have vaginal bleeding after reaching menopause, tell your health care provider.  There are currently no reliable tests available to screen for ovarian cancer.  Lung Cancer Lung cancer screening is recommended for adults 69-62 years old who are at high risk for lung cancer because of a history of smoking. A yearly low-dose CT scan of the lungs is recommended if you:  Currently smoke.  Have a history of at least 30 pack-years of smoking and you currently smoke or have quit within the past 15 years. A pack-year is smoking an average of one pack of cigarettes per day for one year.  Yearly screening should:  Continue until it has been 15 years since you quit.  Stop if you develop a health problem that would prevent you from having lung cancer treatment.  Colorectal Cancer  This type of cancer can be detected and can often be prevented.  Routine colorectal cancer screening usually begins at  age 42 and continues through age 45.  If you have risk factors for colon cancer, your health care provider may recommend that you be screened at an earlier age.  If you have a family history of colorectal cancer, talk with your health care provider about genetic screening.  Your health care provider may also recommend using home test kits to check for hidden blood in your stool.  A small camera at the end of a tube can be used to examine your colon directly (sigmoidoscopy or colonoscopy). This is done to check for the earliest forms of colorectal cancer.  Direct examination of the colon should be repeated every  5-10 years until age 71. However, if early forms of precancerous polyps or small growths are found or if you have a family history or genetic risk for colorectal cancer, you may need to be screened more often.  Skin Cancer  Check your skin from head to toe regularly.  Monitor any moles. Be sure to tell your health care provider: ? About any new moles or changes in moles, especially if there is a change in a mole's shape or color. ? If you have a mole that is larger than the size of a pencil eraser.  If any of your family members has a history of skin cancer, especially at a young age, talk with your health care provider about genetic screening.  Always use sunscreen. Apply sunscreen liberally and repeatedly throughout the day.  Whenever you are outside, protect yourself by wearing long sleeves, pants, a wide-brimmed hat, and sunglasses.  What should I know about osteoporosis? Osteoporosis is a condition in which bone destruction happens more quickly than new bone creation. After menopause, you may be at an increased risk for osteoporosis. To help prevent osteoporosis or the bone fractures that can happen because of osteoporosis, the following is recommended:  If you are 46-71 years old, get at least 1,000 mg of calcium and at least 600 mg of vitamin D per day.  If you are older than age 55 but younger than age 65, get at least 1,200 mg of calcium and at least 600 mg of vitamin D per day.  If you are older than age 54, get at least 1,200 mg of calcium and at least 800 mg of vitamin D per day.  Smoking and excessive alcohol intake increase the risk of osteoporosis. Eat foods that are rich in calcium and vitamin D, and do weight-bearing exercises several times each week as directed by your health care provider. What should I know about how menopause affects my mental health? Depression may occur at any age, but it is more common as you become older. Common symptoms of depression  include:  Low or sad mood.  Changes in sleep patterns.  Changes in appetite or eating patterns.  Feeling an overall lack of motivation or enjoyment of activities that you previously enjoyed.  Frequent crying spells.  Talk with your health care provider if you think that you are experiencing depression. What should I know about immunizations? It is important that you get and maintain your immunizations. These include:  Tetanus, diphtheria, and pertussis (Tdap) booster vaccine.  Influenza every year before the flu season begins.  Pneumonia vaccine.  Shingles vaccine.  Your health care provider may also recommend other immunizations. This information is not intended to replace advice given to you by your health care provider. Make sure you discuss any questions you have with your health care provider. Document Released: 04/07/2005  Document Revised: 09/03/2015 Document Reviewed: 11/17/2014 Elsevier Interactive Patient Education  2018 [employer]  

## 2018-01-31 ENCOUNTER — Encounter: Payer: Self-pay | Admitting: Obstetrics and Gynecology

## 2018-01-31 DIAGNOSIS — D229 Melanocytic nevi, unspecified: Secondary | ICD-10-CM | POA: Insufficient documentation

## 2018-02-28 ENCOUNTER — Other Ambulatory Visit: Payer: Self-pay | Admitting: Family Medicine

## 2018-02-28 DIAGNOSIS — G43709 Chronic migraine without aura, not intractable, without status migrainosus: Secondary | ICD-10-CM

## 2018-03-05 ENCOUNTER — Telehealth: Payer: Self-pay | Admitting: Family Medicine

## 2018-03-05 DIAGNOSIS — G43709 Chronic migraine without aura, not intractable, without status migrainosus: Secondary | ICD-10-CM

## 2018-03-05 NOTE — Telephone Encounter (Signed)
Called in.

## 2018-03-05 NOTE — Telephone Encounter (Signed)
E-prescribing error; please PHONE IN

## 2018-03-12 ENCOUNTER — Ambulatory Visit
Admission: RE | Admit: 2018-03-12 | Discharge: 2018-03-12 | Disposition: A | Discharge status: Home / Self Care | Payer: BC Managed Care – PPO | Source: Ambulatory Visit | Attending: Obstetrics and Gynecology | Admitting: Obstetrics and Gynecology

## 2018-03-12 DIAGNOSIS — Z1239 Encounter for other screening for malignant neoplasm of breast: Secondary | ICD-10-CM | POA: Diagnosis not present

## 2018-08-27 ENCOUNTER — Other Ambulatory Visit: Payer: Self-pay | Admitting: Family Medicine

## 2018-08-27 DIAGNOSIS — G43709 Chronic migraine without aura, not intractable, without status migrainosus: Secondary | ICD-10-CM

## 2018-10-08 ENCOUNTER — Other Ambulatory Visit: Payer: Self-pay

## 2018-10-08 DIAGNOSIS — Z20822 Contact with and (suspected) exposure to covid-19: Secondary | ICD-10-CM

## 2018-10-09 LAB — NOVEL CORONAVIRUS, NAA: SARS-CoV-2, NAA: NOT DETECTED

## 2018-10-24 ENCOUNTER — Telehealth: Payer: Self-pay | Admitting: Family Medicine

## 2018-10-24 DIAGNOSIS — G43709 Chronic migraine without aura, not intractable, without status migrainosus: Secondary | ICD-10-CM

## 2018-10-25 NOTE — Telephone Encounter (Signed)
Patient needs routine follow-up in October

## 2018-10-28 NOTE — Telephone Encounter (Signed)
lvm for pt to call and schedule an appt for oct

## 2018-11-05 ENCOUNTER — Other Ambulatory Visit: Payer: Self-pay

## 2018-11-05 DIAGNOSIS — Z20822 Contact with and (suspected) exposure to covid-19: Secondary | ICD-10-CM

## 2018-11-07 LAB — NOVEL CORONAVIRUS, NAA: SARS-CoV-2, NAA: NOT DETECTED

## 2018-11-15 ENCOUNTER — Encounter: Payer: Self-pay | Admitting: Family Medicine

## 2018-11-15 ENCOUNTER — Ambulatory Visit (INDEPENDENT_AMBULATORY_CARE_PROVIDER_SITE_OTHER): Payer: BC Managed Care – PPO | Admitting: Family Medicine

## 2018-11-15 ENCOUNTER — Other Ambulatory Visit: Payer: Self-pay

## 2018-11-15 VITALS — BP 104/76 | Temp 97.8°F

## 2018-11-15 DIAGNOSIS — G43709 Chronic migraine without aura, not intractable, without status migrainosus: Secondary | ICD-10-CM | POA: Diagnosis not present

## 2018-11-15 DIAGNOSIS — M810 Age-related osteoporosis without current pathological fracture: Secondary | ICD-10-CM | POA: Diagnosis not present

## 2018-11-15 MED ORDER — ELETRIPTAN HYDROBROMIDE 40 MG PO TABS: ORAL_TABLET | ORAL | 11 refills | Status: DC

## 2018-11-15 NOTE — Progress Notes (Signed)
Name: Caroline Harmon   MRN: [id number]    DOB: 03-30-1960   Date:11/15/2018       Progress Note  Subjective:    Chief Complaint  Chief Complaint  Patient presents with  . Follow-up  . Medication Refill    relpax    I connected with  Suriyah Bernthal  on 11/15/18 at  8:00 AM EDT by a video enabled telemedicine application and verified that I am speaking with the correct person using two identifiers.  I discussed the limitations of evaluation and management by telemedicine and the availability of in person appointments. The patient expressed understanding and agreed to proceed. Staff also discussed with the patient that there may be a patient responsible charge related to this service. Patient Location: home Provider Location: [facility] clinic Additional Individuals present: none  HPI   Pt needs refill on migraine medicine.  It ran out after a year of refills and she has not been seen here since last Oct for her physical.  Her PCP unfortunately also retired a few months ago.  She states that she has 8 HA's a month, she take relpax and lays down and it is effective at aborting the HA.  In the past she tried Topamax but it did not work well for her it is noted on her allergy list.  She has not tried any other preventative or abortive medicines.  She is a family history of migraines and her sister is currently trying other medications but at this time she like to continue same therapy.  She also recently retired from teaching and she was hoping to see how decrease stress affected her headache frequency and severity.  In getting to the patient asked her about how often she sees Dr. Jacinto Reap etc. she states that she comes in about once a year for physical or for her migraine medication, her last labs were from October of last year.  She also sees Dr. Marcelline Mates for GYN issues has history of abnormal cervix.  Patient is only taking besides Relpax her supplements with vitamin D and vitamin C and she does have a  history of osteoporosis with her bone scan that was done December 2018.  For that she states she was referred to a specialist as well and she decided not to start medicines like Fosamax because of her age and because of the ability only to use it for 5 years so she is currently supplementing working on weightbearing exercises and does follow-up with a specialist she was going to see if a conservative management improved her bone strength before trying any other medications.  She will schedule her routine physical and come in in person next time for that and for her lab work  Patient Active Problem List   Diagnosis Date Noted  . Benign skin mole 01/31/2018  . Abnormal mammogram of right breast 02/06/2017  . 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 Seborrheic keratosis 01/13/2016  . Cherry angioma 01/13/2016  . Abnormal appearance of cervix 11/18/2014  . Preventative health care 11/12/2014  . Screening for cervical cancer 11/12/2014  . Migraine   . Osteoporosis   . Benign tumor of carotid body     Past Surgical History:  Procedure Laterality Date  . CAROTID BODY TUMOR EXCISION     benign  . COLONOSCOPY  10/06/13    Family History  Problem Relation Age of Onset  . Migraines Mother   . Heart attack Father   . Cancer  Father        skin  . Hypertension Father     Social History   Socioeconomic History  . Marital status: Married    Spouse name: Gershon Mussel  . Number of children: 1  . Years of education: 80  . Highest education level: Bachelor's degree (e.g., BA, AB, BS)  Occupational History  . Not on file  Social Needs  . Financial resource strain: Not hard at all  . Food insecurity    Worry: Never true    Inability: Never true  . Transportation needs    Medical: No    Non-medical: No  Tobacco Use  . Smoking status: Never Smoker  . Smokeless tobacco: Never Used  Substance and Sexual Activity  . Alcohol use: Yes    Comment: occassional  . Drug use: No   . Sexual activity: Yes    Birth control/protection: None, Post-menopausal  Lifestyle  . Physical activity    Days per week: 2 days    Minutes per session: 30 min  . Stress: Not at all  Relationships  . Social Herbalist on phone: Three times a week    Gets together: Three times a week    Attends religious service: More than 4 times per year    Active member of club or organization: No    Attends meetings of clubs or organizations: Never    Relationship status: Married  . Intimate partner violence    Fear of current or ex partner: No    Emotionally abused: No    Physically abused: No    Forced sexual activity: No  Other Topics Concern  . Not on file  Social History Narrative  . Not on file     Current Outpatient Medications:  .  aspirin EC 81 MG tablet, Take 1 tablet (81 mg total) by mouth daily., Disp: , Rfl:  .  eletriptan (RELPAX) 40 MG tablet, May repeat in 2 hours if headache persists or recurs., Disp: 10 tablet, Rfl: 11 .  vitamin C (ASCORBIC ACID) 250 MG tablet, Take 250 mg by mouth daily., Disp: , Rfl:   Allergies  Allergen Reactions  . Topamax [Topiramate] Other (See Comments)    Hematuria    I personally reviewed active problem list, medication list, allergies, family history, social history, health maintenance, notes from last encounter, lab results with the patient/caregiver today.  Review of Systems  Constitutional: Negative.   HENT: Negative.   Eyes: Negative.   Respiratory: Negative.   Cardiovascular: Negative.   Gastrointestinal: Negative.   Endocrine: Negative.   Genitourinary: Negative.   Musculoskeletal: Negative.   Skin: Negative.   Allergic/Immunologic: Negative.   Neurological: Negative.   Hematological: Negative.   Psychiatric/Behavioral: Negative.   All other systems reviewed and are negative.     Objective:    Virtual encounter, vitals limited, only able to obtain the following Today's Vitals   11/15/18 0801  BP:  104/76  Temp: 97.8 F (36.6 C)   There is no height or weight on file to calculate BMI. Nursing Note and Vital Signs reviewed.  Physical Exam Vitals signs and nursing note reviewed.  Constitutional:      General: She is not in acute distress.    Appearance: Normal appearance. She is well-developed. She is not ill-appearing, toxic-appearing or diaphoretic.  HENT:     Head: Normocephalic and atraumatic.     Right Ear: External ear normal.     Left Ear: External ear normal.  Nose: Nose normal.  Eyes:     General: No scleral icterus.       Right eye: No discharge.        Left eye: No discharge.     Conjunctiva/sclera: Conjunctivae normal.  Neck:     Trachea: No tracheal deviation.  Pulmonary:     Effort: Pulmonary effort is normal. No respiratory distress.     Breath sounds: No stridor.  Musculoskeletal: Normal range of motion.  Skin:    Coloration: Skin is not jaundiced.     Findings: No rash.  Neurological:     Mental Status: She is alert.     Motor: No abnormal muscle tone.     Coordination: Coordination normal.  Psychiatric:        Mood and Affect: Mood normal.        Behavior: Behavior normal.     PE limited by telephone encounter  No results found for this or any previous visit (from the past 72 hour(s)).  PHQ2/9: Depression screen [facility] 2/9 11/15/2018 12/13/2017 12/26/2016 08/21/2016 01/13/2016  Decreased Interest 0 0 0 0 0  Down, Depressed, Hopeless 0 0 0 0 0  PHQ - 2 Score 0 0 0 0 0  Altered sleeping 0 0 - - -  Tired, decreased energy 0 0 - - -  Change in appetite 0 0 - - -  Feeling bad or failure about yourself  0 0 - - -  Trouble concentrating 0 0 - - -  Moving slowly or fidgety/restless 0 0 - - -  Suicidal thoughts 0 0 - - -  PHQ-9 Score 0 0 - - -  Difficult doing work/chores Not difficult at all Not difficult at all - - -   PHQ-2/9 Result is negative.    Fall Risk: Fall Risk  11/15/2018 12/13/2017 11/06/2017 12/26/2016 08/21/2016  Falls in the past  year? 0 No No No No  Number falls in past yr: 0 - - - -  Injury with Fall? 0 - - - -  Follow up Falls evaluation completed - - - -     Assessment and Plan:     ICD-10-CM   1. Osteoporosis without current pathological fracture, unspecified osteoporosis type  M81.0    01/2017 to femur, supplementing and seeing specialist (ortho?) for management  2. Chronic migraine without aura without status migrainosus, not intractable  G43.709 eletriptan (RELPAX) 40 MG tablet   8 migraines a month, relpax is effective, doesn't want to try other tx at this time, will f/up if any change in severity or frequency      I discussed the assessment and treatment plan with the patient. The patient was provided an opportunity to ask questions and all were answered. The patient agreed with the plan and demonstrated an understanding of the instructions.  The patient was advised to call back or seek an in-person evaluation if the symptoms worsen or if the condition fails to improve as anticipated.  I provided 15 minutes of non-face-to-face time during this encounter.  Delsa Grana, PA-C 09/18/208:23 AM

## 2018-12-17 ENCOUNTER — Encounter: Payer: Self-pay | Admitting: Family Medicine

## 2018-12-17 ENCOUNTER — Other Ambulatory Visit: Payer: Self-pay

## 2018-12-17 ENCOUNTER — Ambulatory Visit (INDEPENDENT_AMBULATORY_CARE_PROVIDER_SITE_OTHER): Payer: BC Managed Care – PPO | Admitting: Family Medicine

## 2018-12-17 VITALS — BP 114/72 | HR 77 | Temp 96.9°F | Resp 14 | Ht 66.0 in | Wt 133.7 lb

## 2018-12-17 DIAGNOSIS — Z1231 Encounter for screening mammogram for malignant neoplasm of breast: Secondary | ICD-10-CM | POA: Diagnosis not present

## 2018-12-17 DIAGNOSIS — Z1329 Encounter for screening for other suspected endocrine disorder: Secondary | ICD-10-CM

## 2018-12-17 DIAGNOSIS — Z23 Encounter for immunization: Secondary | ICD-10-CM | POA: Diagnosis not present

## 2018-12-17 DIAGNOSIS — Z0000 Encounter for general adult medical examination without abnormal findings: Secondary | ICD-10-CM

## 2018-12-17 DIAGNOSIS — G43709 Chronic migraine without aura, not intractable, without status migrainosus: Secondary | ICD-10-CM | POA: Diagnosis not present

## 2018-12-17 DIAGNOSIS — Z13220 Encounter for screening for lipoid disorders: Secondary | ICD-10-CM

## 2018-12-17 DIAGNOSIS — Z130 Encounter for screening for diseases of the blood and blood-forming organs and certain disorders involving the immune mechanism: Secondary | ICD-10-CM

## 2018-12-17 DIAGNOSIS — M816 Localized osteoporosis [Lequesne]: Secondary | ICD-10-CM

## 2018-12-17 DIAGNOSIS — Z13228 Encounter for screening for other metabolic disorders: Secondary | ICD-10-CM

## 2018-12-17 LAB — COMPLETE METABOLIC PANEL WITH GFR
AG Ratio: 1.9 (calc) (ref 1.0–2.5)
ALT: 12 U/L (ref 6–29)
AST: 17 U/L (ref 10–35)
Albumin: 4.5 g/dL (ref 3.6–5.1)
Alkaline phosphatase (APISO): 75 U/L (ref 37–153)
BUN: 17 mg/dL (ref 7–25)
CO2: 29 mmol/L (ref 20–32)
Calcium: 9.7 mg/dL (ref 8.6–10.4)
Chloride: 105 mmol/L (ref 98–110)
Creat: 0.84 mg/dL (ref 0.50–0.99)
GFR, Est African American: 88 mL/min/{1.73_m2} (ref >=60)
GFR, Est Non African American: 76 mL/min/{1.73_m2} (ref >=60)
Globulin: 2.4 g/dL (calc) (ref 1.9–3.7)
Glucose, Bld: 86 mg/dL (ref 65–99)
Potassium: 5 mmol/L (ref 3.5–5.3)
Sodium: 141 mmol/L (ref 135–146)
Total Bilirubin: 0.7 mg/dL (ref 0.2–1.2)
Total Protein: 6.9 g/dL (ref 6.1–8.1)

## 2018-12-17 LAB — LIPID PANEL
Cholesterol: 174 mg/dL (ref <200)
HDL: 73 mg/dL (ref >=50)
LDL Cholesterol (Calc): 84 mg/dL (calc)
Non-HDL Cholesterol (Calc): 101 mg/dL (calc) (ref <130)
Total CHOL/HDL Ratio: 2.4 (calc) (ref <5.0)
Triglycerides: 79 mg/dL (ref <150)

## 2018-12-17 LAB — CBC WITH DIFFERENTIAL/PLATELET
Absolute Monocytes: 391 cells/uL (ref 200–950)
Basophils Absolute: 61 cells/uL (ref 0–200)
Basophils Relative: 1.1 %
Eosinophils Absolute: 149 cells/uL (ref 15–500)
Eosinophils Relative: 2.7 %
HCT: 40 % (ref 35.0–45.0)
Hemoglobin: 13.2 g/dL (ref 11.7–15.5)
Lymphs Abs: 1469 cells/uL (ref 850–3900)
MCH: 30.9 pg (ref 27.0–33.0)
MCHC: 33 g/dL (ref 32.0–36.0)
MCV: 93.7 fL (ref 80.0–100.0)
MPV: 9.5 fL (ref 7.5–12.5)
Monocytes Relative: 7.1 %
Neutro Abs: 3432 cells/uL (ref 1500–7800)
Neutrophils Relative %: 62.4 %
Platelets: 306 10*3/uL (ref 140–400)
RBC: 4.27 10*6/uL (ref 3.80–5.10)
RDW: 12 % (ref 11.0–15.0)
Total Lymphocyte: 26.7 %
WBC: 5.5 10*3/uL (ref 3.8–10.8)

## 2018-12-17 NOTE — Patient Instructions (Addendum)
Follow up with Caroline Harmon Drema Halon, NP (Attending) DM: [phone] (Work) [phone] (Fax) [place], [place] [address] Endocrinology   Preventive Care 79-60 Years Old, Female Preventive care refers to visits with your health care provider and lifestyle choices that can promote health and wellness. This includes:  A yearly physical exam. This may also be called an annual well check.  Regular dental visits and eye exams.  Immunizations.  Screening for certain conditions.  Healthy lifestyle choices, such as eating a healthy diet, getting regular exercise, not using drugs or products that contain nicotine and tobacco, and limiting alcohol use. What can I expect for my preventive care visit? Physical exam Your health care provider will check your:  Height and weight. This may be used to calculate body mass index (BMI), which tells if you are at a healthy weight.  Heart rate and blood pressure.  Skin for abnormal spots. Counseling Your health care provider may ask you questions about your:  Alcohol, tobacco, and drug use.  Emotional well-being.  Home and relationship well-being.  Sexual activity.  Eating habits.  Work and work Statistician.  Method of birth control.  Menstrual cycle.  Pregnancy history. What immunizations do I need?  Influenza (flu) vaccine  This is recommended every year. Tetanus, diphtheria, and pertussis (Tdap) vaccine  You may need a Td booster every 10 years. Varicella (chickenpox) vaccine  You may need this if you have not been vaccinated. Zoster (shingles) vaccine  You may need this after age 60. Measles, mumps, and rubella (MMR) vaccine  You may need at least one dose of MMR if you were born in 1957 or later. You may also need a second dose. Pneumococcal conjugate (PCV13) vaccine  You may need this if you have certain conditions and were not previously vaccinated. Pneumococcal  polysaccharide (PPSV23) vaccine  You may need one or two doses if you smoke cigarettes or if you have certain conditions. Meningococcal conjugate (MenACWY) vaccine  You may need this if you have certain conditions. Hepatitis A vaccine  You may need this if you have certain conditions or if you travel or work in places where you may be exposed to hepatitis A. Hepatitis B vaccine  You may need this if you have certain conditions or if you travel or work in places where you may be exposed to hepatitis B. Haemophilus influenzae type b (Hib) vaccine  You may need this if you have certain conditions. Human papillomavirus (HPV) vaccine  If recommended by your health care provider, you may need three doses over 6 months. You may receive vaccines as individual doses or as more than one vaccine together in one shot (combination vaccines). Talk with your health care provider about the risks and benefits of combination vaccines. What tests do I need? Blood tests  Lipid and cholesterol levels. These may be checked every 5 years, or more frequently if you are over 41 years old.  Hepatitis C test.  Hepatitis B test. Screening  Lung cancer screening. You may have this screening every year starting at age 60 if you have a 30-pack-year history of smoking and currently smoke or have quit within the past 15 years.  Colorectal cancer screening. All adults should have this screening starting at age 54 and continuing until age 64. Your health care provider may recommend screening at age 41 if you are at increased risk. You will have tests every 1-10 years, depending on your results and the type of screening test.  Diabetes screening. This is done by checking your blood sugar (glucose) after you have not eaten for a while (fasting). You may have this done every 1-3 years.  Mammogram. This may be done every 1-2 years. Talk with your health care provider about when you should start having regular  mammograms. This may depend on whether you have a family history of breast cancer.  BRCA-related cancer screening. This may be done if you have a family history of breast, ovarian, tubal, or peritoneal cancers.  Pelvic exam and Pap test. This may be done every 3 years starting at age 60. Starting at age 81, this may be done every 5 years if you have a Pap test in combination with an HPV test. Other tests  Sexually transmitted disease (STD) testing.  Bone density scan. This is done to screen for osteoporosis. You may have this scan if you are at high risk for osteoporosis. Follow these instructions at home: Eating and drinking  Eat a diet that includes fresh fruits and vegetables, whole grains, lean protein, and low-fat dairy.  Take vitamin and mineral supplements as recommended by your health care provider.  Do not drink alcohol if: ? Your health care provider tells you not to drink. ? You are pregnant, may be pregnant, or are planning to become pregnant.  If you drink alcohol: ? Limit how much you have to 0-1 drink a day. ? Be aware of how much alcohol is in your drink. In the [place] one drink equals one 12 oz bottle of beer (355 mL), one 5 oz glass of wine (148 mL), or one 1 oz glass of hard liquor (44 mL). Lifestyle  Take daily care of your teeth and gums.  Stay active. Exercise for at least 30 minutes on 5 or more days each week.  Do not use any products that contain nicotine or tobacco, such as cigarettes, e-cigarettes, and chewing tobacco. If you need help quitting, ask your health care provider.  If you are sexually active, practice safe sex. Use a condom or other form of birth control (contraception) in order to prevent pregnancy and STIs (sexually transmitted infections).  If told by your health care provider, take low-dose aspirin daily starting at age 87. What's next?  Visit your health care provider once a year for a well check visit.  Ask your health care provider  how often you should have your eyes and teeth checked.  Stay up to date on all vaccines. This information is not intended to replace advice given to you by your health care provider. Make sure you discuss any questions you have with your health care provider. Document Released: 03/12/2015 Document Revised: 10/25/2017 Document Reviewed: 10/25/2017 Elsevier Patient Education  2020 [employer].

## 2018-12-17 NOTE — Progress Notes (Signed)
Patient: Caroline Harmon, Female    DOB: 1961-01-21, 58 y.o.   MRN: [id number] Delsa Grana, PA-C Visit Date: 12/17/2018  Today's Provider: Delsa Grana, PA-C   Chief Complaint  Patient presents with  . Annual Exam    also wants to discuss the flu shot.   Subjective:   Annual physical exam:  Caroline Harmon is a 58 y.o. female who presents today for health maintenance and annual & complete physical exam.   Exercise/Activity:  Walking, exercising  Diet/nutrition:  Very healthy  She reports she is sleeping well.   Osteoporosis with Calcium and Vit D supplements and walking every day, is seeing an endocrinologist specialist/[facility] [place] .  Will repeat Dexa in the next year, last done 01/2017  USPSTF grade A and B recommendations - reviewed and addressed today  Depression:  Phq 9 completed today by patient, was reviewed by me with patient in the room, score is  negative, pt feels good PHQ 2/9 Scores 12/17/2018 11/15/2018 12/13/2017 12/26/2016  PHQ - 2 Score 0 0 0 0  PHQ- 9 Score 0 0 0 -   Depression screen [facility] 2/9 12/17/2018 11/15/2018 12/13/2017 12/26/2016 08/21/2016  Decreased Interest 0 0 0 0 0  Down, Depressed, Hopeless 0 0 0 0 0  PHQ - 2 Score 0 0 0 0 0  Altered sleeping 0 0 0 - -  Tired, decreased energy 0 0 0 - -  Change in appetite 0 0 0 - -  Feeling bad or failure about yourself  0 0 0 - -  Trouble concentrating 0 0 0 - -  Moving slowly or fidgety/restless 0 0 0 - -  Suicidal thoughts 0 0 0 - -  PHQ-9 Score 0 0 0 - -  Difficult doing work/chores Not difficult at all Not difficult at all Not difficult at all - -    Hep C Screening:  done STD testing and prevention (HIV/chl/gon/syphilis):   She believes done in the past, defers Intimate partner violence:  Feels safe Sexual History/Pain during Intercourse:  No discomfort Married Menstrual History/LMP/Abnormal Bleeding:  none No LMP recorded. Patient is postmenopausal. Incontinence Symptoms:    none  Breast cancer:  Breast exam today and mammogram, last in January BRCA gene screening:none done or indicated Cervical cancer screening: UTD, due next year Family hx of cancers - breast, ovarian, uterine, colon  Osteoporosis:   See above - seeing specialist Pt is supplementing with daily calcium/Vit D.  Skin cancer:  Skin CA hx YES - from [facility]? 2017 Last skin survey:  Yearly dermatology  Colorectal cancer:   colonoscopy is UTD, due next 2025  Lung cancer:   Low Dose CT Chest recommended if Age 35-80 years, 30 pack-year currently smoking OR have quit w/in 15years. Patient does not qualify.   Social History   Tobacco Use  . Smoking status: Never Smoker  . Smokeless tobacco: Never Used  Substance Use Topics  . Alcohol use: Yes    Comment: occassional     Alcohol screening:   Office Visit from 12/17/2018 in [facility]  AUDIT-C Score  3      ECG:  None done in the past  Blood pressure/Hypertension: BP Readings from Last 3 Encounters:  12/17/18 114/72  11/15/18 104/76  01/30/18 98/64   Weight/Obesity: Wt Readings from Last 3 Encounters:  12/17/18 133 lb 11.2 oz (60.6 kg)  01/30/18 137 lb (62.1 kg)  12/13/17 135 lb 6.4 oz (61.4  kg)   BMI Readings from Last 3 Encounters:  12/17/18 21.58 kg/m  01/30/18 22.11 kg/m  12/13/17 21.85 kg/m    Lipids:  Lab Results  Component Value Date   CHOL 175 12/13/2017   CHOL 194 12/26/2016   CHOL 184 12/07/2015   Lab Results  Component Value Date   HDL 82 12/13/2017   HDL 94 12/26/2016   HDL 93 12/07/2015   Lab Results  Component Value Date   LDLCALC 78 12/13/2017   LDLCALC 85 12/26/2016   LDLCALC 82 12/07/2015   Lab Results  Component Value Date   TRIG 73 12/13/2017   TRIG 60 12/26/2016   TRIG 47 12/07/2015   Lab Results  Component Value Date   CHOLHDL 2.1 12/13/2017   CHOLHDL 2.1 12/26/2016   CHOLHDL 2.0 12/07/2015   No results found for: LDLDIRECT Based  on the results of lipid panel his/her cardiovascular risk factor ( using [facility] )  in the next 10 years is: The 10-year ASCVD risk score Mikey Bussing DC Brooke Bonito., et al., 2013) is: 1.8%   Values used to calculate the score:     Age: 61 years     Sex: Female     Is Non-Hispanic African American: No     Diabetic: No     Tobacco smoker: No     Systolic Blood Pressure: 833 mmHg     Is BP treated: No     HDL Cholesterol: 82 mg/dL     Total Cholesterol: 175 mg/dL Glucose:  Glucose, Bld  Date Value Ref Range Status  12/13/2017 88 65 - 139 mg/dL Final    Comment:    .        Non-fasting reference interval .   12/26/2016 81 65 - 99 mg/dL Final    Comment:    .            Fasting reference interval .   12/07/2015 84 65 - 99 mg/dL Final    Advanced Care Planning:  A voluntary discussion about advance care planning including the explanation and discussion of advance directives.   Discussed health care proxy and Living will, and the patient was able to identify a health care proxy as husband, Kashia Brossard Patient does have a living will at present time. Copy requested to be scanned in to their chart.    Social History      She  reports that she has never smoked. She has never used smokeless tobacco. She reports current alcohol use. She reports that she does not use drugs.       Social History   Socioeconomic History  . Marital status: Married    Spouse name: Gershon Mussel  . Number of children: 1  . Years of education: 36  . Highest education level: Bachelor's degree (e.g., BA, AB, BS)  Occupational History  . Not on file  Social Needs  . Financial resource strain: Not hard at all  . Food insecurity    Worry: Never true    Inability: Never true  . Transportation needs    Medical: No    Non-medical: No  Tobacco Use  . Smoking status: Never Smoker  . Smokeless tobacco: Never Used  Substance and Sexual Activity  . Alcohol use: Yes    Comment: occassional  . Drug use: No  . Sexual  activity: Yes    Birth control/protection: None, Post-menopausal  Lifestyle  . Physical activity    Days per week: 2 days    Minutes  per session: 30 min  . Stress: Not at all  Relationships  . Social Herbalist on phone: Three times a week    Gets together: Three times a week    Attends religious service: More than 4 times per year    Active member of club or organization: No    Attends meetings of clubs or organizations: Never    Relationship status: Married  Other Topics Concern  . Not on file  Social History Narrative  . Not on file    Family History        Family Status  Relation Name Status  . Mother  Alive  . Father  Deceased       heart attack        Her family history includes Cancer in her father; Heart attack in her father; Hypertension in her father; Migraines in her mother.       Family History  Problem Relation Age of Onset  . Migraines Mother   . Heart attack Father   . Cancer Father        skin  . Hypertension Father     Patient Active Problem List   Diagnosis Date Noted  . Benign skin mole 01/31/2018  . Abnormal mammogram of right breast 02/06/2017  . 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 Seborrheic keratosis 01/13/2016  . Cherry angioma 01/13/2016  . Abnormal appearance of cervix 11/18/2014  . Preventative health care 11/12/2014  . Screening for cervical cancer 11/12/2014  . Migraine   . Osteoporosis   . Benign tumor of carotid body     Past Surgical History:  Procedure Laterality Date  . CAROTID BODY TUMOR EXCISION     benign  . COLONOSCOPY  10/06/13     Current Outpatient Medications:  .  aspirin EC 81 MG tablet, Take 1 tablet (81 mg total) by mouth daily., Disp: , Rfl:  .  cholecalciferol (VITAMIN D3) 25 MCG (1000 UT) tablet, Take 1,000 Units by mouth daily., Disp: , Rfl:  .  eletriptan (RELPAX) 40 MG tablet, May repeat in 2 hours if headache persists or recurs., Disp: 10 tablet, Rfl: 11 .  vitamin  C (ASCORBIC ACID) 250 MG tablet, Take 250 mg by mouth daily., Disp: , Rfl:   Allergies  Allergen Reactions  . Topamax [Topiramate] Other (See Comments)    Hematuria    Patient Care Team: Delsa Grana, PA-C as PCP - General (Family Medicine) [name], MD as Referring Physician (Obstetrics and Gynecology)  I personally reviewed active problem list, medication list, allergies, family history, social history, health maintenance, notes from last encounter, lab results, imaging with the patient/caregiver today.  Review of Systems  Constitutional: Negative.  Negative for activity change, appetite change, fatigue and unexpected weight change.  HENT: Negative.   Eyes: Negative.   Respiratory: Negative.  Negative for shortness of breath.   Cardiovascular: Negative.  Negative for chest pain, palpitations and leg swelling.  Gastrointestinal: Negative.  Negative for abdominal pain and blood in stool.  Endocrine: Negative.   Genitourinary: Negative.   Musculoskeletal: Negative.  Negative for arthralgias, gait problem, joint swelling and myalgias.  Skin: Negative.  Negative for color change, pallor and rash.  Allergic/Immunologic: Negative.   Neurological: Negative.  Negative for syncope and weakness.  Hematological: Negative.   Psychiatric/Behavioral: Negative.  Negative for confusion, dysphoric mood, self-injury and suicidal ideas. The patient is not nervous/anxious.   All other systems reviewed and are negative.  Objective:   Vitals:  Vitals:   12/17/18 0817  BP: 114/72  Pulse: 77  Resp: 14  Temp: (!) 96.9 F (36.1 C)  SpO2: 99%  Weight: 133 lb 11.2 oz (60.6 kg)  Height: 5' 6"  (1.676 m)    Body mass index is 21.58 kg/m.  Physical Exam Constitutional:      General: She is not in acute distress.    Appearance: Normal appearance. She is well-developed and normal weight. She is not ill-appearing, toxic-appearing or diaphoretic.  HENT:     Head: Normocephalic and  atraumatic.     Right Ear: External ear normal.     Left Ear: External ear normal.     Nose: Nose normal.     Mouth/Throat:     Pharynx: Uvula midline.  Eyes:     General: Lids are normal. No scleral icterus.       Right eye: No discharge.        Left eye: No discharge.     Conjunctiva/sclera: Conjunctivae normal.     Pupils: Pupils are equal, round, and reactive to light.  Neck:     Musculoskeletal: Normal range of motion and neck supple.     Thyroid: No thyroid mass, thyromegaly or thyroid tenderness.     Trachea: Phonation normal. No tracheal deviation.  Cardiovascular:     Rate and Rhythm: Normal rate and regular rhythm.     Pulses: Normal pulses.          Radial pulses are 2+ on the right side and 2+ on the left side.       Posterior tibial pulses are 2+ on the right side and 2+ on the left side.     Heart sounds: Normal heart sounds. No murmur. No friction rub. No gallop.   Pulmonary:     Effort: Pulmonary effort is normal. No respiratory distress.     Breath sounds: Normal breath sounds. No stridor. No wheezing, rhonchi or rales.  Chest:     Chest wall: No tenderness.  Abdominal:     General: Bowel sounds are normal. There is no distension.     Palpations: Abdomen is soft.     Tenderness: There is no abdominal tenderness. There is no guarding or rebound.  Musculoskeletal: Normal range of motion.        General: No deformity.     Right lower leg: No edema.     Left lower leg: No edema.  Lymphadenopathy:     Cervical: No cervical adenopathy.  Skin:    General: Skin is warm and dry.     Capillary Refill: Capillary refill takes less than 2 seconds.     Coloration: Skin is not pale.     Findings: No lesion or rash.  Neurological:     Mental Status: She is alert and oriented to person, place, and time.     Motor: No weakness or abnormal muscle tone.     Coordination: Coordination normal.     Gait: Gait normal.  Psychiatric:        Mood and Affect: Mood normal.         Speech: Speech normal.        Behavior: Behavior normal.      Fall Risk: Fall Risk  12/17/2018 11/15/2018 12/13/2017 11/06/2017 12/26/2016  Falls in the past year? 0 0 No No No  Number falls in past yr: 0 0 - - -  Injury with Fall? 0 0 - - -  Follow up -  Falls evaluation completed - - -    Functional Status Survey: Is the patient deaf or have difficulty hearing?: No Does the patient have difficulty seeing, even when wearing glasses/contacts?: No Does the patient have difficulty concentrating, remembering, or making decisions?: No Does the patient have difficulty walking or climbing stairs?: No Does the patient have difficulty dressing or bathing?: No Does the patient have difficulty doing errands alone such as visiting a doctor's office or shopping?: No   Assessment & Plan:    CPE completed today  . USPSTF grade A and B recommendations reviewed with patient; age-appropriate recommendations, preventive care, screening tests, etc discussed and encouraged; healthy living encouraged; see AVS for patient education given to patient  . Discussed importance of 150 minutes of physical activity weekly, AHA exercise recommendations given to pt in AVS/handout  . Discussed importance of healthy diet:  eating lean meats and proteins, avoiding trans fats and saturated fats, avoid simple sugars and excessive carbs in diet, eat 6 servings of fruit/vegetables daily and drink plenty of water and avoid sweet beverages.    . Recommended pt to do annual eye exam and routine dental exams/cleanings  Depression, alcohol, fall screening completed as documented above and per flowsheets  . Reviewed Health Maintenance: Health Maintenance  Topic Date Due  . HIV Screening  12/17/2019 (Originally 04/03/1975)  . MAMMOGRAM  03/13/2019  . PAP SMEAR-Modifier  01/25/2020  . COLONOSCOPY  10/07/2023  . TETANUS/TDAP  11/11/2024  . INFLUENZA VACCINE  Completed  . Hepatitis C Screening  Completed    .  Immunizations: Immunization History  Administered Date(s) Administered  . Influenza,inj,Quad PF,6+ Mos 12/17/2018  . Td 03/22/2004, 05/05/2007  . Tdap 11/12/2014     1. Adult general medical exam Done today with breast exam - CBC with Differential/Platelet - COMPLETE METABOLIC PANEL WITH GFR - Lipid panel - TSH  2. Chronic migraine without aura without status migrainosus, not intractable Well controlled on normal meds  3. Localized osteoporosis without current pathological fracture Seeing Endocrine - cholecalciferol (VITAMIN D3) 25 MCG (1000 UT) tablet; Take 1,000 Units by mouth daily. - CBC with Differential/Platelet - COMPLETE METABOLIC PANEL WITH GFR  4. Encounter for screening mammogram for breast cancer - MM 3D SCREEN BREAST BILATERAL; Future  5. Screening for lipoid disorders - Lipid panel  6. Screening for endocrine, metabolic and immunity disorder - CBC with Differential/Platelet - COMPLETE METABOLIC PANEL WITH GFR - TSH  7.  Need for flu shot - done today      Delsa Grana, PA-C 12/17/18 2:21 PM  [facility]

## 2019-01-10 ENCOUNTER — Other Ambulatory Visit: Payer: Self-pay

## 2019-01-10 DIAGNOSIS — Z20822 Contact with and (suspected) exposure to covid-19: Secondary | ICD-10-CM

## 2019-01-13 LAB — NOVEL CORONAVIRUS, NAA: SARS-CoV-2, NAA: NOT DETECTED

## 2019-03-20 ENCOUNTER — Telehealth: Payer: Self-pay | Admitting: Obstetrics and Gynecology

## 2019-03-20 ENCOUNTER — Other Ambulatory Visit: Payer: Self-pay

## 2019-03-20 DIAGNOSIS — Z1231 Encounter for screening mammogram for malignant neoplasm of breast: Secondary | ICD-10-CM

## 2019-03-26 NOTE — Telephone Encounter (Signed)
error 

## 2019-04-11 ENCOUNTER — Ambulatory Visit
Admission: RE | Admit: 2019-04-11 | Discharge: 2019-04-11 | Disposition: A | Discharge status: Home / Self Care | Payer: BC Managed Care – PPO | Source: Ambulatory Visit | Attending: Obstetrics and Gynecology | Admitting: Obstetrics and Gynecology

## 2019-04-11 DIAGNOSIS — Z1231 Encounter for screening mammogram for malignant neoplasm of breast: Secondary | ICD-10-CM | POA: Insufficient documentation

## 2019-06-05 ENCOUNTER — Other Ambulatory Visit (HOSPITAL_COMMUNITY)
Admission: RE | Admit: 2019-06-05 | Discharge: 2019-06-05 | Disposition: A | Discharge status: Home / Self Care | Payer: BC Managed Care – PPO | Source: Ambulatory Visit | Attending: Obstetrics and Gynecology | Admitting: Obstetrics and Gynecology

## 2019-06-05 ENCOUNTER — Encounter: Payer: Self-pay | Admitting: Obstetrics and Gynecology

## 2019-06-05 ENCOUNTER — Ambulatory Visit (INDEPENDENT_AMBULATORY_CARE_PROVIDER_SITE_OTHER): Payer: BC Managed Care – PPO | Admitting: Obstetrics and Gynecology

## 2019-06-05 ENCOUNTER — Other Ambulatory Visit: Payer: Self-pay

## 2019-06-05 VITALS — BP 125/63 | HR 84 | Ht 66.0 in | Wt 127.6 lb

## 2019-06-05 DIAGNOSIS — M810 Age-related osteoporosis without current pathological fracture: Secondary | ICD-10-CM | POA: Diagnosis not present

## 2019-06-05 DIAGNOSIS — N952 Postmenopausal atrophic vaginitis: Secondary | ICD-10-CM

## 2019-06-05 DIAGNOSIS — Z124 Encounter for screening for malignant neoplasm of cervix: Secondary | ICD-10-CM | POA: Insufficient documentation

## 2019-06-05 DIAGNOSIS — D229 Melanocytic nevi, unspecified: Secondary | ICD-10-CM

## 2019-06-05 DIAGNOSIS — Z1231 Encounter for screening mammogram for malignant neoplasm of breast: Secondary | ICD-10-CM

## 2019-06-05 DIAGNOSIS — Z01419 Encounter for gynecological examination (general) (routine) without abnormal findings: Secondary | ICD-10-CM | POA: Diagnosis not present

## 2019-06-05 DIAGNOSIS — Z780 Asymptomatic menopausal state: Secondary | ICD-10-CM

## 2019-06-05 NOTE — Patient Instructions (Signed)
Health Maintenance for Postmenopausal Women Menopause is a normal process in which your ability to get pregnant comes to an end. This process happens slowly over many months or years, usually between the ages of 48 and 55. Menopause is complete when you have missed your menstrual periods for 12 months. It is important to talk with your health care provider about some of the most common conditions that affect women after menopause (postmenopausal women). These include heart disease, cancer, and bone loss (osteoporosis). Adopting a healthy lifestyle and getting preventive care can help to promote your health and wellness. The actions you take can also lower your chances of developing some of these common conditions. What should I know about menopause? During menopause, you may get a number of symptoms, such as:  Hot flashes. These can be moderate or severe.  Night sweats.  Decrease in sex drive.  Mood swings.  Headaches.  Tiredness.  Irritability.  Memory problems.  Insomnia. Choosing to treat or not to treat these symptoms is a decision that you make with your health care provider. Do I need hormone replacement therapy?  Hormone replacement therapy is effective in treating symptoms that are caused by menopause, such as hot flashes and night sweats.  Hormone replacement carries certain risks, especially as you become older. If you are thinking about using estrogen or estrogen with progestin, discuss the benefits and risks with your health care provider. What is my risk for heart disease and stroke? The risk of heart disease, heart attack, and stroke increases as you age. One of the causes may be a change in the body's hormones during menopause. This can affect how your body uses dietary fats, triglycerides, and cholesterol. Heart attack and stroke are medical emergencies. There are many things that you can do to help prevent heart disease and stroke. Watch your blood pressure  High  blood pressure causes heart disease and increases the risk of stroke. This is more likely to develop in people who have high blood pressure readings, are of African descent, or are overweight.  Have your blood pressure checked: ? Every 3-5 years if you are 18-39 years of age. ? Every year if you are 40 years old or older. Eat a healthy diet   Eat a diet that includes plenty of vegetables, fruits, low-fat dairy products, and lean protein.  Do not eat a lot of foods that are high in solid fats, added sugars, or sodium. Get regular exercise Get regular exercise. This is one of the most important things you can do for your health. Most adults should:  Try to exercise for at least 150 minutes each week. The exercise should increase your heart rate and make you sweat (moderate-intensity exercise).  Try to do strengthening exercises at least twice each week. Do these in addition to the moderate-intensity exercise.  Spend less time sitting. Even light physical activity can be beneficial. Other tips  Work with your health care provider to achieve or maintain a healthy weight.  Do not use any products that contain nicotine or tobacco, such as cigarettes, e-cigarettes, and chewing tobacco. If you need help quitting, ask your health care provider.  Know your numbers. Ask your health care provider to check your cholesterol and your blood sugar (glucose). Continue to have your blood tested as directed by your health care provider. Do I need screening for cancer? Depending on your health history and family history, you may need to have cancer screening at different stages of your life. This   may include screening for:  Breast cancer.  Cervical cancer.  Lung cancer.  Colorectal cancer. What is my risk for osteoporosis? After menopause, you may be at increased risk for osteoporosis. Osteoporosis is a condition in which bone destruction happens more quickly than new bone creation. To help prevent  osteoporosis or the bone fractures that can happen because of osteoporosis, you may take the following actions:  If you are 19-50 years old, get at least 1,000 mg of calcium and at least 600 mg of vitamin D per day.  If you are older than age 50 but younger than age 70, get at least 1,200 mg of calcium and at least 600 mg of vitamin D per day.  If you are older than age 70, get at least 1,200 mg of calcium and at least 800 mg of vitamin D per day. Smoking and drinking excessive alcohol increase the risk of osteoporosis. Eat foods that are rich in calcium and vitamin D, and do weight-bearing exercises several times each week as directed by your health care provider. How does menopause affect my mental health? Depression may occur at any age, but it is more common as you become older. Common symptoms of depression include:  Low or sad mood.  Changes in sleep patterns.  Changes in appetite or eating patterns.  Feeling an overall lack of motivation or enjoyment of activities that you previously enjoyed.  Frequent crying spells. Talk with your health care provider if you think that you are experiencing depression. General instructions See your health care provider for regular wellness exams and vaccines. This may include:  Scheduling regular health, dental, and eye exams.  Getting and maintaining your vaccines. These include: ? Influenza vaccine. Get this vaccine each year before the flu season begins. ? Pneumonia vaccine. ? Shingles vaccine. ? Tetanus, diphtheria, and pertussis (Tdap) booster vaccine. Your health care provider may also recommend other immunizations. Tell your health care provider if you have ever been abused or do not feel safe at home. Summary  Menopause is a normal process in which your ability to get pregnant comes to an end.  This condition causes hot flashes, night sweats, decreased interest in sex, mood swings, headaches, or lack of sleep.  Treatment for this  condition may include hormone replacement therapy.  Take actions to keep yourself healthy, including exercising regularly, eating a healthy diet, watching your weight, and checking your blood pressure and blood sugar levels.  Get screened for cancer and depression. Make sure that you are up to date with all your vaccines. This information is not intended to replace advice given to you by your health care provider. Make sure you discuss any questions you have with your health care provider. Document Revised: 02/06/2018 Document Reviewed: 02/06/2018 Elsevier Patient Education  2020 Else[employer] Inc.    Breast Self-Awareness Breast self-awareness means being familiar with how your breasts look and feel. It involves checking your breasts regularly and reporting any changes to your health care provider. Practicing breast self-awareness is important. Sometimes changes may not be harmful (are benign), but sometimes a change in your breasts can be a sign of a serious medical problem. It is important to learn how to do this procedure correctly so that you can catch problems early, when treatment is more likely to be successful. All women should practice breast self-awareness, including women who have had breast implants. What you need:  A mirror.  A well-lit room. How to do a breast self-exam A breast self-exam is   one way to learn what is normal for your breasts and whether your breasts are changing. To do a breast self-exam: Look for changes  1. Remove all the clothing above your waist. 2. Stand in front of a mirror in a room with good lighting. 3. Put your hands on your hips. 4. Push your hands firmly downward. 5. Compare your breasts in the mirror. Look for differences between them (asymmetry), such as: ? Differences in shape. ? Differences in size. ? Puckers, dips, and bumps in one breast and not the other. 6. Look at each breast for changes in the skin, such as: ? Redness. ? Scaly  areas. 7. Look for changes in your nipples, such as: ? Discharge. ? Bleeding. ? Dimpling. ? Redness. ? A change in position. Feel for changes Carefully feel your breasts for lumps and changes. It is best to do this while lying on your back on the floor, and again while sitting or standing in the tub or shower with soapy water on your skin. Feel each breast in the following way: 1. Place the arm on the side of the breast you are examining above your head. 2. Feel your breast with the other hand. 3. Start in the nipple area and make -inch (2 cm) overlapping circles to feel your breast. Use the pads of your three middle fingers to do this. Apply light pressure, then medium pressure, then firm pressure. The light pressure will allow you to feel the tissue closest to the skin. The medium pressure will allow you to feel the tissue that is a little deeper. The firm pressure will allow you to feel the tissue close to the ribs. 4. Continue the overlapping circles, moving downward over the breast until you feel your ribs below your breast. 5. Move one finger-width toward the center of the body. Continue to use the -inch (2 cm) overlapping circles to feel your breast as you move slowly up toward your collarbone. 6. Continue the up-and-down exam using all three pressures until you reach your armpit.  Write down what you find Writing down what you find can help you remember what to discuss with your health care provider. Write down:  What is normal for each breast.  Any changes that you find in each breast, including: ? The kind of changes you find. ? Any pain or tenderness. ? Size and location of any lumps.  Where you are in your menstrual cycle, if you are still menstruating. General tips and recommendations  Examine your breasts every month.  If you are breastfeeding, the best time to examine your breasts is after a feeding or after using a breast pump.  If you menstruate, the best time to  examine your breasts is 5-7 days after your period. Breasts are generally lumpier during menstrual periods, and it may be more difficult to notice changes.  With time and practice, you will become more familiar with the variations in your breasts and more comfortable with the exam. Contact a health care provider if you:  See a change in the shape or size of your breasts or nipples.  See a change in the skin of your breast or nipples, such as a reddened or scaly area.  Have unusual discharge from your nipples.  Find a lump or thick area that was not there before.  Have pain in your breasts.  Have any concerns related to your breast health. Summary  Breast self-awareness includes looking for physical changes in your breasts, as well   as feeling for any changes within your breasts.  Breast self-awareness should be performed in front of a mirror in a well-lit room.  You should examine your breasts every month. If you menstruate, the best time to examine your breasts is 5-7 days after your menstrual period.  Let your health care provider know of any changes you notice in your breasts, including changes in size, changes on the skin, pain or tenderness, or unusual fluid from your nipples. This information is not intended to replace advice given to you by your health care provider. Make sure you discuss any questions you have with your health care provider. Document Revised: 10/02/2017 Document Reviewed: 10/02/2017 Elsevier Patient Education  2020 Elsevier Inc.  

## 2019-06-05 NOTE — Progress Notes (Signed)
Pt present for annual exam. Pt's last pap 01/24/17. Last mammogram 04/11/2019. Pt stated that she was doing well and denies any issues at this time.

## 2019-06-05 NOTE — Progress Notes (Signed)
ANNUAL PREVENTATIVE CARE GYNECOLOGY  ENCOUNTER NOTE  Subjective:       Caroline Harmon is a 59 y.o. G70P2002 female here for a routine annual gynecologic exam. The patient is sexually active. The patient has never been on hormone replacement therapy. Patient denies post-menopausal vaginal bleeding. The patient wears seatbelts: yes. The patient participates in regular exercise: yes (walking daily up to 5 miles). Has the patient ever been transfused or tattooed?: no  Current complaints: 1.  None    Gynecologic History No LMP recorded. Patient is postmenopausal. Contraception: post menopausal status Last Pap: 12/2016. Results were: ASCUS with negative HPV. Prior history of abnormal pap smear in 2015 (NILM but + HR HPV) Last mammogram: 04/11/2019. Results were: normal Last Colonoscopy: 2015.  Was normal. Repeat in 10 years.  Last Dexa Scan: 01/2017.  Osteoporosis.  T score is - 2.8. For repeat in 2 years.    Obstetric History OB History  Gravida Para Term Preterm AB Living  2 2 2     2   SAB TAB Ectopic Multiple Live Births          2    # Outcome Date GA Lbr Len/2nd Weight Sex Delivery Anes PTL Lv  2 Term 05/14/89    M Vag-Spont   LIV  1 Term 05/29/87    F Vag-Spont   LIV    Past Medical History:  Diagnosis Date  . 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 Migraine   . Osteoporosis     Family History  Problem Relation Age of Onset  . Migraines Mother   . Heart attack Father   . Cancer Father        skin  . Hypertension Father   . Breast cancer Neg Hx     Past Surgical History:  Procedure Laterality Date  . CAROTID BODY TUMOR EXCISION     benign  . COLONOSCOPY  10/06/13    Social History   Socioeconomic History  . Marital status: Married    Spouse name: Gershon Mussel  . Number of children: 1  . Years of education: 68  . Highest education level: Bachelor's degree (e.g., BA, AB, BS)  Occupational History  . Not on file  Tobacco Use  . Smoking  status: Never Smoker  . Smokeless tobacco: Never Used  Substance and Sexual Activity  . Alcohol use: Yes    Comment: occassional  . Drug use: No  . Sexual activity: Yes    Birth control/protection: None, Post-menopausal  Other Topics Concern  . Not on file  Social History Narrative  . Not on file   Social Determinants of Health   Financial Resource Strain:   . Difficulty of Paying Living Expenses:   Food Insecurity:   . Worried About Charity fundraiser in the Last Year:   . Arboriculturist in the Last Year:   Transportation Needs:   . Film/video editor (Medical):   Marland Kitchen Lack of Transportation (Non-Medical):   Physical Activity:   . Days of Exercise per Week:   . Minutes of Exercise per Session:   Stress:   . Feeling of Stress :   Social Connections:   . Frequency of Communication with Friends and Family:   . Frequency of Social Gatherings with Friends and Family:   . Attends Religious Services:   . Active Member of Clubs or Organizations:   . Attends Archivist Meetings:   Marland Kitchen Marital  Status:   Intimate Partner Violence:   . Fear of Current or Ex-Partner:   . Emotionally Abused:   Marland Kitchen Physically Abused:   . Sexually Abused:     Current Outpatient Medications on File Prior to Visit  Medication Sig Dispense Refill  . aspirin EC 81 MG tablet Take 1 tablet (81 mg total) by mouth daily.    . Calcium-Magnesium-Vitamin D (CALCIUM 1200+D3 PO) Take by mouth.    . cholecalciferol (VITAMIN D3) 25 MCG (1000 UT) tablet Take 1,000 Units by mouth daily.    Marland Kitchen eletriptan (RELPAX) 40 MG tablet May repeat in 2 hours if headache persists or recurs. 10 tablet 11  . vitamin C (ASCORBIC ACID) 250 MG tablet Take 250 mg by mouth daily.     No current facility-administered medications on file prior to visit.    Allergies  Allergen Reactions  . Topamax [Topiramate] Other (See Comments)    Hematuria      Review of Systems ROS Review of Systems - General ROS: negative for -  chills, fatigue, fever, hot flashes, night sweats, weight gain or weight loss Psychological ROS: negative for - anxiety, decreased libido, depression, mood swings, physical abuse or sexual abuse Ophthalmic ROS: negative for - blurry vision, eye pain or loss of vision ENT ROS: negative for - headaches, hearing change, visual changes or vocal changes Allergy and Immunology ROS: negative for - hives, itchy/watery eyes or seasonal allergies Hematological and Lymphatic ROS: negative for - bleeding problems, bruising, swollen lymph nodes or weight loss Endocrine ROS: negative for - galactorrhea, hair pattern changes, hot flashes, malaise/lethargy, mood swings, palpitations, polydipsia/polyuria, skin changes, temperature intolerance or unexpected weight changes Breast ROS: negative for - new or changing breast lumps or nipple discharge Respiratory ROS: negative for - cough or shortness of breath Cardiovascular ROS: negative for - chest pain, irregular heartbeat, palpitations or shortness of breath Gastrointestinal ROS: no abdominal pain, change in bowel habits, or black or bloody stools Genito-Urinary ROS: no dysuria, trouble voiding, or hematuria Musculoskeletal ROS: negative for - joint pain or joint stiffness Neurological ROS: negative for - bowel and bladder control changes Dermatological ROS: negative for rash and skin lesion changes   Objective:   BP 125/63   Pulse 84   Ht 5\' 6"  (1.676 m)   Wt 127 lb 9.6 oz (57.9 kg)   BMI 20.60 kg/m  CONSTITUTIONAL: Well-developed, well-nourished female in no acute distress.  PSYCHIATRIC: Normal mood and affect. Normal behavior. Normal judgment and thought content. [place]: Alert and oriented to person, place, and time. Normal muscle tone coordination. No cranial nerve deficit noted. HENT:  Normocephalic, atraumatic, External right and left ear normal. Oropharynx is clear and moist EYES: Conjunctivae and EOM are normal. Pupils are equal, round, and  reactive to light. No scleral icterus.  NECK: Normal range of motion, supple, no masses.  Normal thyroid.  SKIN: Skin is warm and dry. No rash noted. Not diaphoretic. No erythema. No pallor. Multiple benign-appearing small skin moles in generalized distribution along chest and trunk. Also with few Clotile [place] angiomas.  CARDIOVASCULAR: Normal heart rate noted, regular rhythm, no murmur. RESPIRATORY: Clear to auscultation bilaterally. Effort and breath sounds normal, no problems with respiration noted. BREASTS: Symmetric in size. No masses, skin changes, nipple drainage, or lymphadenopathy. ABDOMEN: Soft, normal bowel sounds, no distention noted.  No tenderness, rebound or guarding.  BLADDER: Normal PELVIC:  Bladder no bladder distension noted  Urethra: normal appearing urethra with no masses, tenderness or lesions  Vulva: normal appearing  vulva with no masses, tenderness or lesions  Vagina: normal appearing vagina with no discharge, no lesions. Mild vaginal atrophy.   Cervix: normal appearing cervix without discharge or lesions  Uterus: uterus is normal size, shape, consistency and nontender  Adnexa: normal adnexa in size, nontender and no masses  RV: External Exam NormaI, No Rectal Masses and Normal Sphincter tone  MUSCULOSKELETAL: Normal range of motion. No tenderness.  No cyanosis, clubbing, or edema.  2+ distal pulses. LYMPHATIC: No Axillary, Supraclavicular, or Inguinal Adenopathy.   Labs: Lab Results  Component Value Date   WBC 5.5 12/17/2018   HGB 13.2 12/17/2018   HCT 40.0 12/17/2018   MCV 93.7 12/17/2018   PLT 306 12/17/2018    Lab Results  Component Value Date   CREATININE 0.84 12/17/2018   BUN 17 12/17/2018   NA 141 12/17/2018   K 5.0 12/17/2018   CL 105 12/17/2018   CO2 29 12/17/2018    Lab Results  Component Value Date   ALT 12 12/17/2018   AST 17 12/17/2018   ALKPHOS 69 12/07/2015   BILITOT 0.7 12/17/2018    Lab Results  Component Value Date   CHOL 174  12/17/2018   HDL 73 12/17/2018   LDLCALC 84 12/17/2018   TRIG 79 12/17/2018   CHOLHDL 2.4 12/17/2018    Lab Results  Component Value Date   TSH 1.76 12/13/2017    Lab Results  Component Value Date   CHOL 174 12/17/2018   HDL 73 12/17/2018   LDLCALC 84 12/17/2018   TRIG 79 12/17/2018   CHOLHDL 2.4 12/17/2018     Assessment:   Annual gynecologic examination 59 y.o. Normal BMI  Vaginal atrophy Menopause Osteoporosis Skin moles  Plan:  Pap: up to date. H/o last pap ASCUS but negative HPV.Repeat pap smear due today.  Mammogram: Up to date. Continue yearly screening Stool Guaiac Testing:  Not Ordered. Up to date on colonosocopy.  Labs: reviewed. Previously ordered by PCP.  Routine preventative health maintenance measures emphasized: Exercise/Diet/Weight control, Alcohol/Substance use risks and Stress Management.   Currently on Vitamin D and calcium for osteoporosis management.  Vaginal atrophy and menopause asymptomatic.  Follows up yearly with Dermatologist (routine follow up) for skin moles.  Return to [place], MD  [facility]

## 2019-06-09 LAB — CYTOLOGY - PAP
Comment: NEGATIVE
Diagnosis: NEGATIVE
High risk HPV: NEGATIVE

## 2019-11-06 ENCOUNTER — Other Ambulatory Visit: Payer: Self-pay | Admitting: Family Medicine

## 2019-11-06 DIAGNOSIS — G43709 Chronic migraine without aura, not intractable, without status migrainosus: Secondary | ICD-10-CM

## 2019-11-06 NOTE — Telephone Encounter (Signed)
appt scheduled for your first availability, which is October. She think she will have enough to last until then.

## 2019-12-08 ENCOUNTER — Ambulatory Visit: Payer: BC Managed Care – PPO | Admitting: Family Medicine

## 2019-12-08 ENCOUNTER — Encounter: Payer: Self-pay | Admitting: Family Medicine

## 2019-12-08 ENCOUNTER — Other Ambulatory Visit: Payer: Self-pay

## 2019-12-08 VITALS — BP 124/82 | HR 91 | Temp 98.0°F | Resp 14 | Ht 66.0 in | Wt 120.2 lb

## 2019-12-08 DIAGNOSIS — G43709 Chronic migraine without aura, not intractable, without status migrainosus: Secondary | ICD-10-CM | POA: Diagnosis not present

## 2019-12-08 DIAGNOSIS — Z760 Encounter for issue of repeat prescription: Secondary | ICD-10-CM | POA: Diagnosis not present

## 2019-12-08 DIAGNOSIS — M810 Age-related osteoporosis without current pathological fracture: Secondary | ICD-10-CM | POA: Diagnosis not present

## 2019-12-08 DIAGNOSIS — R634 Abnormal weight loss: Secondary | ICD-10-CM

## 2019-12-08 MED ORDER — ELETRIPTAN HYDROBROMIDE 40 MG PO TABS: ORAL_TABLET | ORAL | 11 refills | Status: DC

## 2019-12-08 NOTE — Progress Notes (Signed)
Name: Caroline Harmon   MRN: [id number]    DOB: May 06, 1960   Date:12/08/2019       Progress Note  Chief Complaint  Patient presents with  . Headache    refill medications     Subjective:   Caroline Harmon is a 59 y.o. female, presents to clinic for routine follow-up and medication refills  Osteoporosis 2018 - saw Endocrinology in 2020 On a full dose of Calcium and Vit D and walking and doing weight bearing exercises - [place] She is overdue for follow-up and for her bone density scan She is hesitant about taking any medications, plan with endocrinology was to work on good supplementation and exercise for over a year and recheck the bone scan  Reviewed prior endocrinology office visit notes and prior DEXA today with the patient  Migraines - refills on relpax, about one migraine a week, they have improved gradually over the years, rarely needs a second dose or has more severe headaches.   Current Outpatient Medications:  .  aspirin EC 81 MG tablet, Take 1 tablet (81 mg total) by mouth daily., Disp: , Rfl:  .  Calcium-Vitamin D-Vitamin K (CALCIUM SOFT CHEWS) [phone] MG-UNT-MCG CHEW, , Disp: , Rfl:  .  eletriptan (RELPAX) 40 MG tablet, May repeat in 2 hours if headache persists or recurs., Disp: 10 tablet, Rfl: 11 .  Calcium-Magnesium-Vitamin D (CALCIUM 1200+D3 PO), Take by mouth., Disp: , Rfl:  .  cholecalciferol (VITAMIN D3) 25 MCG (1000 UT) tablet, Take 1,000 Units by mouth daily., Disp: , Rfl:  .  vitamin C (ASCORBIC ACID) 250 MG tablet, Take 250 mg by mouth daily. (Patient not taking: Reported on 12/08/2019), Disp: , Rfl:   Patient Active Problem List   Diagnosis Date Noted  . Benign skin mole 01/31/2018  . 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 Seborrheic keratosis 01/13/2016  . Cherry angioma 01/13/2016  . Preventative health care 11/12/2014  . Migraine   . Osteoporosis   . Benign tumor of carotid body     Past Surgical History:    Procedure Laterality Date  . CAROTID BODY TUMOR EXCISION     benign  . COLONOSCOPY  10/06/13    Family History  Problem Relation Age of Onset  . Migraines Mother   . Heart attack Father   . Cancer Father        skin  . Hypertension Father   . Breast cancer Neg Hx     Social History   Tobacco Use  . Smoking status: Never Smoker  . Smokeless tobacco: Never Used  Vaping Use  . Vaping Use: Never used  Substance Use Topics  . Alcohol use: Yes    Comment: occassional  . Drug use: No     Allergies  Allergen Reactions  . Topamax [Topiramate] Other (See Comments)    Hematuria    Health Maintenance  Topic Date Due  . HIV Screening  12/17/2019 (Originally 04/03/1973)  . MAMMOGRAM  04/10/2020  . PAP SMEAR-Modifier  06/05/2022  . COLONOSCOPY  10/07/2023  . TETANUS/TDAP  11/11/2024  . INFLUENZA VACCINE  Completed  . Hepatitis C Screening  Completed    Chart Review Today: I personally reviewed active problem list, medication list, allergies, family history, social history, health maintenance, notes from last encounter, lab results, imaging with the patient/caregiver today.   Review of Systems  10 Systems reviewed and are negative for acute change except as noted in the HPI.  Objective:  Vitals:   12/08/19 1024  BP: 124/82  Pulse: 91  Resp: 14  Temp: 98 F (36.7 C)  TempSrc: Oral  SpO2: 99%  Weight: 120 lb 3.2 oz (54.5 kg)  Height: 5\' 6"  (1.676 m)    Body mass index is 19.4 kg/m.  Physical Exam Vitals and nursing note reviewed.  Constitutional:      General: She is not in acute distress.    Appearance: Normal appearance. She is not ill-appearing, toxic-appearing or diaphoretic.  HENT:     Head: Normocephalic and atraumatic.     Right Ear: External ear normal.     Left Ear: External ear normal.  Eyes:     General: No scleral icterus.       Right eye: No discharge.        Left eye: No discharge.     Conjunctiva/sclera: Conjunctivae normal.   Cardiovascular:     Rate and Rhythm: Normal rate and regular rhythm.     Pulses: Normal pulses.     Heart sounds: Normal heart sounds.  Pulmonary:     Effort: Pulmonary effort is normal.     Breath sounds: Normal breath sounds.  Abdominal:     General: Bowel sounds are normal.     Palpations: Abdomen is soft.  Musculoskeletal:     Right lower leg: No edema.     Left lower leg: No edema.  Skin:    General: Skin is warm and dry.     Capillary Refill: Capillary refill takes less than 2 seconds.     Coloration: Skin is not jaundiced or pale.  Neurological:     Mental Status: She is alert. Mental status is at baseline.     Gait: Gait normal.  Psychiatric:        Mood and Affect: Mood normal.        Behavior: Behavior normal.         Assessment & Plan:   1. Chronic migraine without aura without status migrainosus, not intractable Stable, well controlled, gradually improving, only about one migraine a week, much better since retiring, stress if a trigger  - eletriptan (RELPAX) 40 MG tablet; May repeat in 2 hours if headache persists or recurs.  Dispense: 10 tablet; Refill: 11  2. Osteoporosis without current pathological fracture, unspecified osteoporosis type Lost to f/up with endocrinology She has been doing supplementation and walking/exercise for the past 2 years +  Overdue for f/up dexa Ordered, reviewed and educated about Dexa, Frax, osteoporosis prevention, fall prevention, medications Pt will do dexa and f/up with endocrinology - Calcium-Vitamin D-Vitamin K (CALCIUM SOFT CHEWS) [phone] MG-UNT-MCG CHEW - DG Bone Density; Future  3. Weight loss She reports loosing weight due to healthy diet and increased exercise, encouraged her to monitor weight and ensure she is getting enough protein and calories  4. Medication refill meds refilled, pt would like to come in once a year for CPE and all routine f/up and med refills in the future - she is healthy - will try to  accommodate her requests/wishes   F/up for next CPE   Delsa Grana, PA-C 12/08/19 10:59 AM

## 2019-12-08 NOTE — Patient Instructions (Signed)
[facility] at [facility] [place],  [place]  [address] Get Driving Directions Main: [phone]   Preventing Osteoporosis, Adult Osteoporosis is a condition that causes the bones to lose density. This means that the bones become thinner, and the normal spaces in bone tissue become larger. Low bone density can make the bones weak and cause them to break more easily. Osteoporosis cannot always be prevented, but you can take steps to lower your risk of developing this condition. How can this condition affect me? If you develop osteoporosis, you will be more likely to break bones in your wrist, spine, or hip. Even a minor accident or injury can be enough to break weak bones. The bones will also be slower to heal. Osteoporosis can cause other problems as well, such as a stooped posture or trouble with movement. Osteoporosis can occur with aging. As you get older, you may lose bone tissue more quickly, or it may be replaced more slowly. Osteoporosis is more likely to develop if you have poor nutrition or do not get enough calcium or vitamin D. Other lifestyle factors can also play a role. By eating a well-balanced diet and making lifestyle changes, you can help keep your bones strong and healthy, lowering your chances of developing osteoporosis. What can increase my risk? The following factors may make you more likely to develop osteoporosis:  Having a family history of the condition.  Having poor nutrition or not getting enough calcium or vitamin D.  Using certain medicines, such as steroid medicines or antiseizure medicines.  Being any of the following: ? 51 years of age or older. ? Female. ? A woman who has gone through menopause (is postmenopausal). ? White (Caucasian) or of Asian descent.  Smoking or having a history of smoking.  Not being physically active (being sedentary).  Having a small body frame. What actions can I take to prevent  this?  Get enough calcium   Make sure you get enough calcium every day. Calcium is the most important mineral for bone health. Most people can get enough calcium from their diet, but supplements may be recommended for people who are at risk for osteoporosis. Follow these guidelines: ? If you are age 67 or younger, aim to get 1,000 mg of calcium every day. ? If you are older than age 40, aim to get 1,200 mg of calcium every day.  Good sources of calcium include: ? Dairy products, such as low-fat or nonfat milk, cheese, and yogurt. ? Dark green leafy vegetables, such as bok choy and broccoli. ? Foods that have had calcium added to them (calcium-fortified foods), such as orange juice, cereal, bread, soy beverages, and tofu products. ? Nuts, such as almonds.  Check nutrition labels to see how much calcium is in a food or drink. Get enough vitamin D  Try to get enough vitamin D every day. Vitamin D is the most essential vitamin for bone health. It helps the body absorb calcium. Follow these guidelines for how much vitamin D to get from food: ? If you are age 52 or younger, aim to get at least 600 international units (IU) every day. Your health care provider may suggest more. ? If you are older than age 68, aim to get at least 800 international units every day. Your health care provider may suggest more.  Good sources of vitamin D in your diet include: ? Egg yolks. ? Oily fish, such as salmon, sardines, and tuna. ? Milk  and cereal fortified with vitamin D.  Your body also makes vitamin D when you are out in the sun. Exposing the bare skin on your face, arms, legs, or back to the sun for no more than 30 minutes a day, 2 times a week is more than enough. Beyond that, make sure you use sunblock to protect your skin from sunburn, which increases your risk for skin cancer. Exercise  Stay active and get exercise every day.  Ask your health care provider what types of exercise are best for you.  Weight-bearing and strength-building activities are important for building and maintaining healthy bones. Some examples of these types of activities include: ? Walking and hiking. ? Jogging and running. ? Dancing. ? Gym exercises. ? Lifting weights. ? Tennis and racquetball. ? Climbing stairs. ? Aerobics. Make other lifestyle changes  Do not use any products that contain nicotine or tobacco, such as cigarettes, e-cigarettes, and chewing tobacco. If you need help quitting, ask your health care provider.  Lose weight if you are overweight.  If you drink alcohol: ? Limit how much you use to:  0-1 drink a day for nonpregnant women.  0-2 drinks a day for men. ? Be aware of how much alcohol is in your drink. In the [place] one drink equals one 12 oz bottle of beer (355 mL), one 5 oz glass of wine (148 mL), or one 1 oz glass of hard liquor (44 mL). Where to find support If you need help making changes to prevent osteoporosis, talk with your health care provider. You can ask for a referral to a diet and nutrition specialist (dietitian) and a physical therapist. Where to find more information Learn more about osteoporosis from:  NIH Osteoporosis and Related [place]: www.bones.SouthExposed.es  [place].S. Office on [employer]: [place]s.tn  [place]: EquipmentWeekly.com.ee Summary  Osteoporosis is a condition that causes weak bones that are more likely to break.  Eat a healthy diet, making sure you get enough calcium and vitamin D, and stay active by getting regular exercise to help prevent osteoporosis.  Other ways to reduce your risk of osteoporosis include maintaining a healthy weight and avoiding alcohol and products that contain nicotine or tobacco. This information is not intended to replace advice given to you by your health care provider. Make sure you discuss any questions you have with your health care provider. Document Revised:  09/13/2018 Document Reviewed: 09/13/2018 Elsevier Patient Education  [place].

## 2019-12-10 ENCOUNTER — Encounter: Payer: Self-pay | Admitting: Family Medicine

## 2019-12-24 ENCOUNTER — Ambulatory Visit
Admission: RE | Admit: 2019-12-24 | Discharge: 2019-12-24 | Disposition: A | Discharge status: Home / Self Care | Payer: BC Managed Care – PPO | Source: Ambulatory Visit | Attending: Family Medicine | Admitting: Family Medicine

## 2019-12-24 ENCOUNTER — Other Ambulatory Visit: Payer: Self-pay

## 2019-12-24 DIAGNOSIS — M810 Age-related osteoporosis without current pathological fracture: Secondary | ICD-10-CM | POA: Insufficient documentation

## 2020-01-26 ENCOUNTER — Ambulatory Visit (INDEPENDENT_AMBULATORY_CARE_PROVIDER_SITE_OTHER): Payer: BC Managed Care – PPO | Admitting: Family Medicine

## 2020-01-26 ENCOUNTER — Encounter: Payer: Self-pay | Admitting: Family Medicine

## 2020-01-26 ENCOUNTER — Other Ambulatory Visit: Payer: Self-pay

## 2020-01-26 VITALS — BP 112/72 | HR 97 | Temp 98.5°F | Resp 16 | Ht 66.0 in | Wt 120.3 lb

## 2020-01-26 DIAGNOSIS — M25521 Pain in right elbow: Secondary | ICD-10-CM

## 2020-01-26 DIAGNOSIS — H6122 Impacted cerumen, left ear: Secondary | ICD-10-CM | POA: Diagnosis not present

## 2020-01-26 DIAGNOSIS — Z1231 Encounter for screening mammogram for malignant neoplasm of breast: Secondary | ICD-10-CM

## 2020-01-26 DIAGNOSIS — Z0000 Encounter for general adult medical examination without abnormal findings: Secondary | ICD-10-CM | POA: Diagnosis not present

## 2020-01-26 DIAGNOSIS — Z1329 Encounter for screening for other suspected endocrine disorder: Secondary | ICD-10-CM

## 2020-01-26 DIAGNOSIS — Z13228 Encounter for screening for other metabolic disorders: Secondary | ICD-10-CM

## 2020-01-26 DIAGNOSIS — M810 Age-related osteoporosis without current pathological fracture: Secondary | ICD-10-CM

## 2020-01-26 DIAGNOSIS — Z130 Encounter for screening for diseases of the blood and blood-forming organs and certain disorders involving the immune mechanism: Secondary | ICD-10-CM

## 2020-01-26 DIAGNOSIS — Z13220 Encounter for screening for lipoid disorders: Secondary | ICD-10-CM

## 2020-01-26 NOTE — Patient Instructions (Addendum)
Health Maintenance  Topic Date Due  . HIV Screening  01/25/2021*  . Mammogram  04/10/2020  . DEXA scan (bone density measurement)  12/23/2021  . Pap Smear  06/05/2022  . Colon Cancer Screening  10/07/2023  . Tetanus Vaccine  11/11/2024  . Flu Shot  Completed  .  Hepatitis C: One time screening is recommended by Center for Disease Control  (CDC) for  adults born from [birth year] through 1967.   Completed  *Topic was postponed. The date shown is not the original due date.    [facility] at [place],  [place]  [address] Get Driving Directions Main: [phone]   Preventive Care 32-20 Years Old, Female Preventive care refers to visits with your health care provider and lifestyle choices that can promote health and wellness. This includes:  A yearly physical exam. This may also be called an annual well check.  Regular dental visits and eye exams.  Immunizations.  Screening for certain conditions.  Healthy lifestyle choices, such as eating a healthy diet, getting regular exercise, not using drugs or products that contain nicotine and tobacco, and limiting alcohol use. What can I expect for my preventive care visit? Physical exam Your health care provider will check your:  Height and weight. This may be used to calculate body mass index (BMI), which tells if you are at a healthy weight.  Heart rate and blood pressure.  Skin for abnormal spots. Counseling Your health care provider may ask you questions about your:  Alcohol, tobacco, and drug use.  Emotional well-being.  Home and relationship well-being.  Sexual activity.  Eating habits.  Work and work Statistician.  Method of birth control.  Menstrual cycle.  Pregnancy history. What immunizations do I need?  Influenza (flu) vaccine  This is recommended every year. Tetanus, diphtheria, and pertussis (Tdap) vaccine  You may need a Td booster every 10 years. Varicella  (chickenpox) vaccine  You may need this if you have not been vaccinated. Zoster (shingles) vaccine  You may need this after age 60. Measles, mumps, and rubella (MMR) vaccine  You may need at least one dose of MMR if you were born in 1957 or later. You may also need a second dose. Pneumococcal conjugate (PCV13) vaccine  You may need this if you have certain conditions and were not previously vaccinated. Pneumococcal polysaccharide (PPSV23) vaccine  You may need one or two doses if you smoke cigarettes or if you have certain conditions. Meningococcal conjugate (MenACWY) vaccine  You may need this if you have certain conditions. Hepatitis A vaccine  You may need this if you have certain conditions or if you travel or work in places where you may be exposed to hepatitis A. Hepatitis B vaccine  You may need this if you have certain conditions or if you travel or work in places where you may be exposed to hepatitis B. Haemophilus influenzae type b (Hib) vaccine  You may need this if you have certain conditions. Human papillomavirus (HPV) vaccine  If recommended by your health care provider, you may need three doses over 6 months. You may receive vaccines as individual doses or as more than one vaccine together in one shot (combination vaccines). Talk with your health care provider about the risks and benefits of combination vaccines. What tests do I need? Blood tests  Lipid and cholesterol levels. These may be checked every 5 years, or more frequently if you are over 61 years old.  Hepatitis  C test.  Hepatitis B test. Screening  Lung cancer screening. You may have this screening every year starting at age 44 if you have a 30-pack-year history of smoking and currently smoke or have quit within the past 15 years.  Colorectal cancer screening. All adults should have this screening starting at age 68 and continuing until age 89. Your health care provider may recommend screening at  age 82 if you are at increased risk. You will have tests every 1-10 years, depending on your results and the type of screening test.  Diabetes screening. This is done by checking your blood sugar (glucose) after you have not eaten for a while (fasting). You may have this done every 1-3 years.  Mammogram. This may be done every 1-2 years. Talk with your health care provider about when you should start having regular mammograms. This may depend on whether you have a family history of breast cancer.  BRCA-related cancer screening. This may be done if you have a family history of breast, ovarian, tubal, or peritoneal cancers.  Pelvic exam and Pap test. This may be done every 3 years starting at age 84. Starting at age 67, this may be done every 5 years if you have a Pap test in combination with an HPV test. Other tests  Sexually transmitted disease (STD) testing.  Bone density scan. This is done to screen for osteoporosis. You may have this scan if you are at high risk for osteoporosis. Follow these instructions at home: Eating and drinking  Eat a diet that includes fresh fruits and vegetables, whole grains, lean protein, and low-fat dairy.  Take vitamin and mineral supplements as recommended by your health care provider.  Do not drink alcohol if: ? Your health care provider tells you not to drink. ? You are pregnant, may be pregnant, or are planning to become pregnant.  If you drink alcohol: ? Limit how much you have to 0-1 drink a day. ? Be aware of how much alcohol is in your drink. In the [place] one drink equals one 12 oz bottle of beer (355 mL), one 5 oz glass of wine (148 mL), or one 1 oz glass of hard liquor (44 mL). Lifestyle  Take daily care of your teeth and gums.  Stay active. Exercise for at least 30 minutes on 5 or more days each week.  Do not use any products that contain nicotine or tobacco, such as cigarettes, e-cigarettes, and chewing tobacco. If you need help quitting,  ask your health care provider.  If you are sexually active, practice safe sex. Use a condom or other form of birth control (contraception) in order to prevent pregnancy and STIs (sexually transmitted infections).  If told by your health care provider, take low-dose aspirin daily starting at age 79. What's next?  Visit your health care provider once a year for a well check visit.  Ask your health care provider how often you should have your eyes and teeth checked.  Stay up to date on all vaccines. This information is not intended to replace advice given to you by your health care provider. Make sure you discuss any questions you have with your health care provider. Document Revised: 10/25/2017 Document Reviewed: 10/25/2017 Elsevier Patient Education  2020 [employer].

## 2020-01-26 NOTE — Progress Notes (Signed)
Patient: Caroline Harmon, Female    DOB: 06/02/1960, 59 y.o.   MRN: [id number] Delsa Grana, PA-C Visit Date: 01/26/2020  Today's Provider: Delsa Grana, PA-C   Chief Complaint  Patient presents with  . Annual Exam   Subjective:   Annual physical exam:  Caroline Harmon is a 59 y.o. female who presents today for complete physical exam:  Exercise/Activity:   Very active 5-6 d a week exercising  Diet/nutrition:  healthy Sleep: sleeping well  Ear itching and feeling blocked and like she's under water Left ear cerumen impaction - no q-tip use, no npain  Right elbow pain x 2 years to medial right elbow/medial epicondyle - very sore, aches when she just holds the phone up to her right ear to talk, tender to the touch, no redness, swelling or past injury  Recent OV - pt in the future would like to do one combined OV and CPE to manage migraine and osteoporosis: Osteoporosis 2018 - saw Endocrinology in 2020 On a full dose of Calcium and Vit D and walking and doing weight bearing exercises - [place] She is overdue for follow-up and for her bone density scan She is hesitant about taking any medications, plan with endocrinology was to work on good supplementation and exercise for over a year and recheck the bone scan  Reviewed prior endocrinology office visit notes and prior DEXA today with the patient  Migraines - refills on relpax, about one migraine a week, they have improved gradually over the years, rarely needs a second dose or has more severe headaches.   Pt wished to discuss acute complaints today in addition to CPE. Advised pt of separate visit billing/coding  USPSTF grade A and B recommendations - reviewed and addressed today  Depression:  Phq 9 completed today by patient, was reviewed by me with patient in the room PHQ score is neg, pt feels good PHQ 2/9 Scores 01/26/2020 12/08/2019 12/17/2018 11/15/2018  PHQ - 2 Score 0 0 0 0  PHQ- 9 Score - - 0 0   Depression  screen [facility] 2/9 01/26/2020 12/08/2019 12/17/2018 11/15/2018 12/13/2017  Decreased Interest 0 0 0 0 0  Down, Depressed, Hopeless 0 0 0 0 0  PHQ - 2 Score 0 0 0 0 0  Altered sleeping - - 0 0 0  Tired, decreased energy - - 0 0 0  Change in appetite - - 0 0 0  Feeling bad or failure about yourself  - - 0 0 0  Trouble concentrating - - 0 0 0  Moving slowly or fidgety/restless - - 0 0 0  Suicidal thoughts - - 0 0 0  PHQ-9 Score - - 0 0 0  Difficult doing work/chores - - Not difficult at all Not difficult at all Not difficult at all    Alcohol screening:   Office Visit from 01/26/2020 in [facility]  AUDIT-C Score 0      Immunizations and Health Maintenance: Health Maintenance  Topic Date Due  . HIV Screening  01/25/2021 (Originally 04[birth year]03/1973)  . MAMMOGRAM  04/10/2020  . DEXA SCAN  12/23/2021  . PAP SMEAR-Modifier  06/05/2022  . COLONOSCOPY  10/07/2023  . TETANUS/TDAP  11/11/2024  . INFLUENZA VACCINE  Completed  . Hepatitis C Screening  Completed     Hep C Screening: done  STD testing and prevention (HIV/chl/gon/syphilis):  see above, no additional testing desired by pt today  Intimate partner violence:denies  Sexual History/Pain during Intercourse: Married  Menstrual History/LMP/Abnormal Bleeding:  no AUB No LMP recorded. Patient is postmenopausal.  Incontinence Symptoms: denies  Breast cancer:   UTD, due in Feb Last Mammogram: *see HM list above   Cervical cancer screening: done with Dr. Marcelline Mates 05/2019 PAP and HPV neg Pt denies family hx of cancers - breast, ovarian, uterine, colon:     Osteoporosis:  Dx of, dexa done last month, reviewed, no significant change Discussion on osteoporosis per age, including high calcium and vitamin D supplementation, weight bearing exercises Pt is is supplementing with daily calcium/Vit D. See above -  Bone scan/dexa   Skin cancer:  Hx of skin CA -  NO - sees dermatology regularly Discussed atypical lesions    Colorectal cancer:   Colonoscopy isUTD due in 2025 Discussed concerning signs and sx of CRC, pt denies melena hematochezia, change in bowels  Lung cancer:   Low Dose CT Chest recommended if Age 28-80 years, 30 pack-year currently smoking OR have quit w/in 15years. Patient does not qualify.    Social History   Tobacco Use  . Smoking status: Never Smoker  . Smokeless tobacco: Never Used  Vaping Use  . Vaping Use: Never used  Substance Use Topics  . Alcohol use: Yes    Comment: occassional  . Drug use: No       Office Visit from 01/26/2020 in [facility]  AUDIT-C Score 0      Family History  Problem Relation Age of Onset  . Migraines Mother   . Heart attack Father   . Cancer Father        skin  . Hypertension Father   . Breast cancer Neg Hx      Blood pressure/Hypertension: BP Readings from Last 3 Encounters:  01/26/20 112/72  12/08/19 124/82  06/05/19 125/63    Weight/Obesity: Wt Readings from Last 3 Encounters:  01/26/20 120 lb 4.8 oz (54.6 kg)  12/08/19 120 lb 3.2 oz (54.5 kg)  06/05/19 127 lb 9.6 oz (57.9 kg)   BMI Readings from Last 3 Encounters:  01/26/20 19.42 kg/m  12/08/19 19.40 kg/m  06/05/19 20.60 kg/m     Lipids:  Lab Results  Component Value Date   CHOL 174 12/17/2018   CHOL 175 12/13/2017   CHOL 194 12/26/2016   Lab Results  Component Value Date   HDL 73 12/17/2018   HDL 82 12/13/2017   HDL 94 12/26/2016   Lab Results  Component Value Date   LDLCALC 84 12/17/2018   LDLCALC 78 12/13/2017   LDLCALC 85 12/26/2016   Lab Results  Component Value Date   TRIG 79 12/17/2018   TRIG 73 12/13/2017   TRIG 60 12/26/2016   Lab Results  Component Value Date   CHOLHDL 2.4 12/17/2018   CHOLHDL 2.1 12/13/2017   CHOLHDL 2.1 12/26/2016   No results found for: LDLDIRECT Based on the results of lipid panel his/her cardiovascular risk factor ( using [facility] )  in the next 10 years is: The 10-year ASCVD risk  score Mikey Bussing DC Brooke Bonito., et al., 2013) is: 2.1%   Values used to calculate the score:     Age: 64 years     Sex: Female     Is Non-Hispanic African American: No     Diabetic: No     Tobacco smoker: No     Systolic Blood Pressure: 983 mmHg     Is BP treated: No     HDL Cholesterol: 73 mg/dL     Total Cholesterol: 174 mg/dL Glucose:  Glucose, Bld  Date Value Ref Range Status  12/17/2018 86 65 - 99 mg/dL Final    Comment:    .            Fasting reference interval .   12/13/2017 88 65 - 139 mg/dL Final    Comment:    .        Non-fasting reference interval .   12/26/2016 81 65 - 99 mg/dL Final    Comment:    .            Fasting reference interval .    Hypertension: BP Readings from Last 3 Encounters:  01/26/20 112/72  12/08/19 124/82  06/05/19 125/63   Obesity: Wt Readings from Last 3 Encounters:  01/26/20 120 lb 4.8 oz (54.6 kg)  12/08/19 120 lb 3.2 oz (54.5 kg)  06/05/19 127 lb 9.6 oz (57.9 kg)   BMI Readings from Last 3 Encounters:  01/26/20 19.42 kg/m  12/08/19 19.40 kg/m  06/05/19 20.60 kg/m    Advanced Care Planning:  A voluntary discussion about advance care planning including the explanation and discussion of advance directives.    Social History      She        Social History   Socioeconomic History  . Marital status: Married    Spouse name: Gershon Mussel  . Number of children: 1  . Years of education: 48  . Highest education level: Bachelor's degree (e.g., BA, AB, BS)  Occupational History  . Not on file  Tobacco Use  . Smoking status: Never Smoker  . Smokeless tobacco: Never Used  Vaping Use  . Vaping Use: Never used  Substance and Sexual Activity  . Alcohol use: Yes    Comment: occassional  . Drug use: No  . Sexual activity: Yes    Birth control/protection: None, Post-menopausal  Other Topics Concern  . Not on file  Social History Narrative  . Not on file   Social Determinants of Health   Financial Resource Strain: Low Risk   .  Difficulty of Paying Living Expenses: Not hard at all  Food Insecurity: No Food Insecurity  . Worried About Charity fundraiser in the Last Year: Never true  . Ran Out of Food in the Last Year: Never true  Transportation Needs: No Transportation Needs  . Lack of Transportation (Medical): No  . Lack of Transportation (Non-Medical): No  Physical Activity: Sufficiently Active  . Days of Exercise per Week: 5 days  . Minutes of Exercise per Session: 60 min  Stress: No Stress Concern Present  . Feeling of Stress : Not at all  Social Connections: Socially Integrated  . Frequency of Communication with Friends and Family: More than three times a week  . Frequency of Social Gatherings with Friends and Family: More than three times a week  . Attends Religious Services: 1 to 4 times per year  . Active Member of Clubs or Organizations: Yes  . Attends Archivist Meetings: 1 to 4 times per year  . Marital Status: Married    Family History        Family History  Problem Relation Age of Onset  . Migraines Mother   . Heart attack Father   . Cancer Father        skin  . Hypertension Father   . Breast cancer Neg Hx     Patient Active Problem List   Diagnosis Date Noted  . Benign skin mole 01/31/2018  . 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 Seborrheic keratosis 01/13/2016  . Cherry angioma 01/13/2016  . Preventative health care 11/12/2014  . Migraine   . Osteoporosis   . Benign tumor of carotid body     Past Surgical History:  Procedure Laterality Date  . CAROTID BODY TUMOR EXCISION     benign  . COLONOSCOPY  10/06/13     Current Outpatient Medications:  .  aspirin EC 81 MG tablet, Take 1 tablet (81 mg total) by mouth daily., Disp: , Rfl:  .  Calcium-Vitamin D-Vitamin K (CALCIUM SOFT CHEWS) [phone] MG-UNT-MCG CHEW, , Disp: , Rfl:  .  eletriptan (RELPAX) 40 MG tablet, May repeat in 2 hours if headache persists or recurs., Disp: 10 tablet, Rfl:  11  Allergies  Allergen Reactions  . Topamax [Topiramate] Other (See Comments)    Hematuria    Patient Care Team: Delsa Grana, PA-C as PCP - General (Family Medicine) [name], MD as Referring Physician (Obstetrics and Gynecology)  Review of Systems  10 Systems reviewed and are negative for acute change except as noted in the HPI.     I personally reviewed active problem list, medication list, allergies, family history, social history, health maintenance, notes from last encounter, lab results, imaging with the patient/caregiver today.         Objective:   Vitals:  Vitals:   01/26/20 1326  BP: 112/72  Pulse: 97  Resp: 16  Temp: 98.5 F (36.9 C)  TempSrc: Oral  SpO2: 99%  Weight: 120 lb 4.8 oz (54.6 kg)  Height: 5\' 6"  (1.676 m)    Body mass index is 19.42 kg/m.  Physical Exam Vitals and nursing note reviewed.  Constitutional:      General: She is not in acute distress.    Appearance: Normal appearance. She is well-developed and normal weight. She is not ill-appearing, toxic-appearing or diaphoretic.     Interventions: Face mask in place.  HENT:     Head: Normocephalic and atraumatic.     Right Ear: Tympanic membrane, ear canal and external ear normal. There is no impacted cerumen.     Left Ear: External ear normal. There is impacted cerumen.     Nose: Congestion present. No rhinorrhea.     Mouth/Throat:     Mouth: Mucous membranes are moist.     Pharynx: Oropharynx is clear. No oropharyngeal exudate or posterior oropharyngeal erythema.  Eyes:     General: Lids are normal. No scleral icterus.       Right eye: No discharge.        Left eye: No discharge.     Conjunctiva/sclera: Conjunctivae normal.     Pupils: Pupils are equal, round, and reactive to light.  Neck:     Trachea: Phonation normal. No tracheal deviation.  Cardiovascular:     Rate and Rhythm: Normal rate and regular rhythm.     Pulses: Normal pulses.          Radial pulses are 2+ on  the right side and 2+ on the left side.       Posterior tibial pulses are 2+ on the right side and 2+ on the left side.     Heart sounds: Normal heart sounds. No murmur heard.  No friction rub. No gallop.   Pulmonary:     Effort: Pulmonary effort is normal. No respiratory distress.     Breath sounds: Normal breath sounds. No stridor. No wheezing, rhonchi or rales.  Chest:  Chest wall: No tenderness.  Abdominal:     General: Bowel sounds are normal. There is no distension.     Palpations: Abdomen is soft.     Tenderness: There is no abdominal tenderness. There is no guarding.  Musculoskeletal:     Right elbow: No swelling, deformity or effusion. Normal range of motion. Tenderness present in medial epicondyle. No radial head, lateral epicondyle or olecranon process tenderness.     Right lower leg: No edema.     Left lower leg: No edema.  Skin:    General: Skin is warm and dry.     Coloration: Skin is not jaundiced or pale.     Findings: No bruising, lesion or rash.  Neurological:     Mental Status: She is alert. Mental status is at baseline.     Motor: No abnormal muscle tone.     Gait: Gait normal.  Psychiatric:        Mood and Affect: Mood normal.        Speech: Speech normal.        Behavior: Behavior normal.       Fall Risk: Fall Risk  01/26/2020 12/08/2019 12/17/2018 11/15/2018 12/13/2017  Falls in the past year? 0 0 0 0 No  Number falls in past yr: 0 0 0 0 -  Injury with Fall? 0 0 0 0 -  Follow up Falls evaluation completed Falls evaluation completed - Falls evaluation completed -    Functional Status Survey: Is the patient deaf or have difficulty hearing?: No Does the patient have difficulty seeing, even when wearing glasses/contacts?: No Does the patient have difficulty concentrating, remembering, or making decisions?: No Does the patient have difficulty walking or climbing stairs?: No Does the patient have difficulty dressing or bathing?: No Does the patient  have difficulty doing errands alone such as visiting a doctor's office or shopping?: No   Assessment & Plan:    CPE completed today  . USPSTF grade A and B recommendations reviewed with patient; age-appropriate recommendations, preventive care, screening tests, etc discussed and encouraged; healthy living encouraged; see AVS for patient education given to patient  . Discussed importance of 150 minutes of physical activity weekly, AHA exercise recommendations given to pt in AVS/handout  . Discussed importance of healthy diet:  eating lean meats and proteins, avoiding trans fats and saturated fats, avoid simple sugars and excessive carbs in diet, eat 6 servings of fruit/vegetables daily and drink plenty of water and avoid sweet beverages.    . Recommended pt to do annual eye exam and routine dental exams/cleanings  . Depression, alcohol, fall screening completed as documented above and per flowsheets  . Reviewed Health Maintenance: Health Maintenance  Topic Date Due  . HIV Screening  01/25/2021 (Originally 04/03/1973)  . MAMMOGRAM  04/10/2020  . DEXA SCAN  12/23/2021  . PAP SMEAR-Modifier  06/05/2022  . COLONOSCOPY  10/07/2023  . TETANUS/TDAP  11/11/2024  . INFLUENZA VACCINE  Completed  . Hepatitis C Screening  Completed    . Immunizations: Immunization History  Administered Date(s) Administered  . Influenza,inj,Quad PF,6+ Mos 12/17/2018  . Influenza-Unspecified 12/01/2019  . Td 03/22/2004, 05/05/2007  . Tdap 11/12/2014     ICD-10-CM   1. Adult general medical exam  Z00.00 CBC with Differential/Platelet    COMPLETE METABOLIC PANEL WITH GFR    Lipid panel  2. Encounter for screening mammogram for malignant neoplasm of breast  Z12.31 MM 3D SCREEN BREAST BILATERAL  3. Right elbow pain  M25.521  medial epicondyle right elbow pain x 2 years, ttp, no swelling or erythema, tx with elbow brace/strap and can f/up with ortho if not improving  4. Impacted cerumen of left ear  H61.22     try debrox drops, gentle home irrigation with warm water and hydrogen peroxide, invited to get OV to f/up in clinic if she needs procedure done or recheck  5. Osteoporosis without current pathological fracture, unspecified osteoporosis type  M81.0    reviewed recent dexa and management - will continue with supplementation and weight bearing activity, f/up dexa 2years     Delsa Grana, PA-C 01/26/20 2:15 PM   [facility]

## 2020-01-27 LAB — LIPID PANEL
Cholesterol: 195 mg/dL (ref <200)
HDL: 86 mg/dL (ref >=50)
LDL Cholesterol (Calc): 90 mg/dL (calc)
Non-HDL Cholesterol (Calc): 109 mg/dL (calc) (ref <130)
Total CHOL/HDL Ratio: 2.3 (calc) (ref <5.0)
Triglycerides: 101 mg/dL (ref <150)

## 2020-01-27 LAB — COMPLETE METABOLIC PANEL WITH GFR
AG Ratio: 1.9 (calc) (ref 1.0–2.5)
ALT: 27 U/L (ref 6–29)
AST: 34 U/L (ref 10–35)
Albumin: 4.8 g/dL (ref 3.6–5.1)
Alkaline phosphatase (APISO): 75 U/L (ref 37–153)
BUN: 25 mg/dL (ref 7–25)
CO2: 30 mmol/L (ref 20–32)
Calcium: 10.3 mg/dL (ref 8.6–10.4)
Chloride: 102 mmol/L (ref 98–110)
Creat: 0.89 mg/dL (ref 0.50–0.99)
GFR, Est African American: 81 mL/min/{1.73_m2} (ref >=60)
GFR, Est Non African American: 70 mL/min/{1.73_m2} (ref >=60)
Globulin: 2.5 g/dL (calc) (ref 1.9–3.7)
Glucose, Bld: 79 mg/dL (ref 65–99)
Potassium: 4.3 mmol/L (ref 3.5–5.3)
Sodium: 140 mmol/L (ref 135–146)
Total Bilirubin: 0.6 mg/dL (ref 0.2–1.2)
Total Protein: 7.3 g/dL (ref 6.1–8.1)

## 2020-01-27 LAB — CBC WITH DIFFERENTIAL/PLATELET
Absolute Monocytes: 393 cells/uL (ref 200–950)
Basophils Absolute: 68 cells/uL (ref 0–200)
Basophils Relative: 1.2 %
Eosinophils Absolute: 239 cells/uL (ref 15–500)
Eosinophils Relative: 4.2 %
HCT: 40.4 % (ref 35.0–45.0)
Hemoglobin: 13.4 g/dL (ref 11.7–15.5)
Lymphs Abs: 1824 cells/uL (ref 850–3900)
MCH: 30.7 pg (ref 27.0–33.0)
MCHC: 33.2 g/dL (ref 32.0–36.0)
MCV: 92.4 fL (ref 80.0–100.0)
MPV: 9.5 fL (ref 7.5–12.5)
Monocytes Relative: 6.9 %
Neutro Abs: 3175 cells/uL (ref 1500–7800)
Neutrophils Relative %: 55.7 %
Platelets: 312 10*3/uL (ref 140–400)
RBC: 4.37 10*6/uL (ref 3.80–5.10)
RDW: 11.2 % (ref 11.0–15.0)
Total Lymphocyte: 32 %
WBC: 5.7 10*3/uL (ref 3.8–10.8)

## 2020-02-19 ENCOUNTER — Other Ambulatory Visit: Payer: Self-pay

## 2020-02-19 ENCOUNTER — Ambulatory Visit
Admission: EM | Admit: 2020-02-19 | Discharge: 2020-02-19 | Disposition: A | Discharge status: Home / Self Care | Payer: BC Managed Care – PPO | Attending: Family Medicine | Admitting: Family Medicine

## 2020-02-19 DIAGNOSIS — J069 Acute upper respiratory infection, unspecified: Secondary | ICD-10-CM | POA: Insufficient documentation

## 2020-02-19 DIAGNOSIS — Z20822 Contact with and (suspected) exposure to covid-19: Secondary | ICD-10-CM | POA: Diagnosis not present

## 2020-02-19 LAB — RESP PANEL BY RT-PCR (FLU A&B, COVID) ARPGX2
Influenza A by PCR: NEGATIVE
Influenza B by PCR: NEGATIVE
SARS Coronavirus 2 by RT PCR: NEGATIVE

## 2020-02-19 LAB — GROUP A STREP BY PCR: Group A Strep by PCR: NOT DETECTED

## 2020-02-19 NOTE — Discharge Instructions (Signed)
Strep negative.  Awaiting COVID test results - I will call.  Supportive care - warm salt water gargles, tylenol and/or ibuprofen.  Take care  Dr. Lacinda Axon

## 2020-02-19 NOTE — ED Provider Notes (Signed)
[facility]    CSN: [id number] Arrival date & time: 02/19/20  0932      History   Chief Complaint Chief Complaint  Patient presents with  . Nasal Congestion   HPI   59 year old female presents with congestion and sore throat.  Symptoms started yesterday.  She reports congestion, sore throat, and mild cough.  Grandchild has been sick.  She is concerned that she may have strep throat.  No fever.  No relieving factors.  Patient is requesting testing today.  No other complaints or concerns at this time.   Past Medical History:  Diagnosis Date  . 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 Migraine   . Osteoporosis     Patient Active Problem List   Diagnosis Date Noted  . Benign skin mole 01/31/2018  . 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 Seborrheic keratosis 01/13/2016  . Cherry angioma 01/13/2016  . Preventative health care 11/12/2014  . Migraine   . Osteoporosis   . Benign tumor of carotid body     Past Surgical History:  Procedure Laterality Date  . CAROTID BODY TUMOR EXCISION     benign  . COLONOSCOPY  10/06/13    OB History    Gravida  2   Para  2   Term  2   Preterm      AB      Living  2     SAB      IAB      Ectopic      Multiple      Live Births  2            Home Medications    Prior to Admission medications   Medication Sig Start Date End Date Taking? Authorizing Provider  aspirin EC 81 MG tablet Take 1 tablet (81 mg total) by mouth daily. 12/26/16   Arnetha Courser, MD  Calcium-Vitamin D-Vitamin K (CALCIUM SOFT CHEWS) [phone] MG-UNT-MCG CHEW  11/07/19   [provider]  eletriptan (RELPAX) 40 MG tablet May repeat in 2 hours if headache persists or recurs. 12/08/19   Delsa Grana, PA-C    Family History Family History  Problem Relation Age of Onset  . Migraines Mother   . Heart attack Father   . Cancer Father        skin  . Hypertension Father   .  Breast cancer Neg Hx     Social History Social History   Tobacco Use  . Smoking status: Never Smoker  . Smokeless tobacco: Never Used  Vaping Use  . Vaping Use: Never used  Substance Use Topics  . Alcohol use: Yes    Comment: occassional  . Drug use: No     Allergies   Topamax [topiramate]   Review of Systems Review of Systems  HENT: Positive for congestion and sore throat.   Respiratory: Positive for cough.    Physical Exam Triage Vital Signs ED Triage Vitals [02/19/20 0955]  Enc Vitals Group     BP 136/74     Pulse Rate 80     Resp 15     Temp 98.3 F (36.8 C)     Temp Source Oral     SpO2 100 %     Weight 125 lb (56.7 kg)     Height 5' 6.5" (1.689 m)     Head Circumference      Peak Flow  Pain Score 0     Pain Loc      Pain Edu?      Excl. in [place]?    Updated Vital Signs BP 136/74 (BP Location: Right Arm)   Pulse 80   Temp 98.3 F (36.8 C) (Oral)   Resp 15   Ht 5' 6.5" (1.689 m)   Wt 56.7 kg   SpO2 100%   BMI 19.87 kg/m   Visual Acuity Right Eye Distance:   Left Eye Distance:   Bilateral Distance:    Right Eye Near:   Left Eye Near:    Bilateral Near:     Physical Exam Vitals and nursing note reviewed.  Constitutional:      General: She is not in acute distress.    Appearance: Normal appearance. She is not ill-appearing.  HENT:     Head: Normocephalic and atraumatic.     Mouth/Throat:     Pharynx: Posterior oropharyngeal erythema present. No oropharyngeal exudate.  Cardiovascular:     Rate and Rhythm: Normal rate and regular rhythm.  Pulmonary:     Effort: Pulmonary effort is normal.     Breath sounds: Normal breath sounds. No wheezing, rhonchi or rales.  Neurological:     Mental Status: She is alert.  Psychiatric:        Mood and Affect: Mood normal.        Behavior: Behavior normal.    UC Treatments / Results  Labs (all labs ordered are listed, but only abnormal results are displayed) Labs Reviewed  RESP PANEL BY  RT-PCR (FLU A&B, COVID) [id number]  GROUP A STREP BY PCR    EKG   Radiology No results found.  Procedures Procedures (including critical care time)  Medications Ordered in UC Medications - No data to display  Initial Impression / Assessment and Plan / UC Course  I have reviewed the triage vital signs and the nursing notes.  Pertinent labs & imaging results that were available during my care of the patient were reviewed by me and considered in my medical decision making (see chart for details).    59 year old female presents with viral URI with cough.  Strep negative.  Covid negative.  Supportive care.  Over-the-counter Tylenol and ibuprofen.  Saltwater gargles.  Final Clinical Impressions(s) / UC Diagnoses   Final diagnoses:  Viral URI with cough     Discharge Instructions     Strep negative.  Awaiting COVID test results - I will call.  Supportive care - warm salt water gargles, tylenol and/or ibuprofen.  Take care  Dr. Lacinda Axon    ED Prescriptions    None     PDMP not reviewed this encounter.   Coral Spikes, [place] 02/19/20 1552

## 2020-02-19 NOTE — ED Triage Notes (Addendum)
Pt with nasal congestion and sore throat since yesterday. Asking for strep test

## 2020-03-22 ENCOUNTER — Encounter: Payer: Self-pay | Admitting: Family Medicine

## 2020-03-28 ENCOUNTER — Encounter: Payer: Self-pay | Admitting: Family Medicine

## 2020-06-08 NOTE — Patient Instructions (Addendum)
Preventive Care 84-62 Years Old, Female Preventive care refers to lifestyle choices and visits with your health care provider that can promote health and wellness. This includes:  A yearly physical exam. This is also called an annual wellness visit.  Regular dental and eye exams.  Immunizations.  Screening for certain conditions.  Healthy lifestyle choices, such as: ? Eating a healthy diet. ? Getting regular exercise. ? Not using drugs or products that contain nicotine and tobacco. ? Limiting alcohol use. What can I expect for my preventive care visit? Physical exam Your health care provider will check your:  Height and weight. These may be used to calculate your BMI (body mass index). BMI is a measurement that tells if you are at a healthy weight.  Heart rate and blood pressure.  Body temperature.  Skin for abnormal spots. Counseling Your health care provider may ask you questions about your:  Past medical problems.  Family's medical history.  Alcohol, tobacco, and drug use.  Emotional well-being.  Home life and relationship well-being.  Sexual activity.  Diet, exercise, and sleep habits.  Work and work Statistician.  Access to firearms.  Method of birth control.  Menstrual cycle.  Pregnancy history. What immunizations do I need? Vaccines are usually given at various ages, according to a schedule. Your health care provider will recommend vaccines for you based on your age, medical history, and lifestyle or other factors, such as travel or where you work.   What tests do I need? Blood tests  Lipid and cholesterol levels. These may be checked every 5 years, or more often if you are over 3 years old.  Hepatitis C test.  Hepatitis B test. Screening  Lung cancer screening. You may have this screening every year starting at age 73 if you have a 30-pack-year history of smoking and currently smoke or have quit within the past 15 years.  Colorectal cancer  screening. ? All adults should have this screening starting at age 52 and continuing until age 17. ? Your health care provider may recommend screening at age 49 if you are at increased risk. ? You will have tests every 1-10 years, depending on your results and the type of screening test.  Diabetes screening. ? This is done by checking your blood sugar (glucose) after you have not eaten for a while (fasting). ? You may have this done every 1-3 years.  Mammogram. ? This may be done every 1-2 years. ? Talk with your health care provider about when you should start having regular mammograms. This may depend on whether you have a family history of breast cancer.  BRCA-related cancer screening. This may be done if you have a family history of breast, ovarian, tubal, or peritoneal cancers.  Pelvic exam and Pap test. ? This may be done every 3 years starting at age 10. ? Starting at age 11, this may be done every 5 years if you have a Pap test in combination with an HPV test. Other tests  STD (sexually transmitted disease) testing, if you are at risk.  Bone density scan. This is done to screen for osteoporosis. You may have this scan if you are at high risk for osteoporosis. Talk with your health care provider about your test results, treatment options, and if necessary, the need for more tests. Follow these instructions at home: Eating and drinking  Eat a diet that includes fresh fruits and vegetables, whole grains, lean protein, and low-fat dairy products.  Take vitamin and mineral supplements  as recommended by your health care provider.  Do not drink alcohol if: ? Your health care provider tells you not to drink. ? You are pregnant, may be pregnant, or are planning to become pregnant.  If you drink alcohol: ? Limit how much you have to 0-1 drink a day. ? Be aware of how much alcohol is in your drink. In the [place] one drink equals one 12 oz bottle of beer (355 mL), one 5 oz glass of  wine (148 mL), or one 1 oz glass of hard liquor (44 mL).   Lifestyle  Take daily care of your teeth and gums. Brush your teeth every morning and night with fluoride toothpaste. Floss one time each day.  Stay active. Exercise for at least 30 minutes 5 or more days each week.  Do not use any products that contain nicotine or tobacco, such as cigarettes, e-cigarettes, and chewing tobacco. If you need help quitting, ask your health care provider.  Do not use drugs.  If you are sexually active, practice safe sex. Use a condom or other form of protection to prevent STIs (sexually transmitted infections).  If you do not wish to become pregnant, use a form of birth control. If you plan to become pregnant, see your health care provider for a prepregnancy visit.  If told by your health care provider, take low-dose aspirin daily starting at age 75.  Find healthy ways to cope with stress, such as: ? Meditation, yoga, or listening to music. ? Journaling. ? Talking to a trusted person. ? Spending time with friends and family. Safety  Always wear your seat belt while driving or riding in a vehicle.  Do not drive: ? If you have been drinking alcohol. Do not ride with someone who has been drinking. ? When you are tired or distracted. ? While texting.  Wear a helmet and other protective equipment during sports activities.  If you have firearms in your house, make sure you follow all gun safety procedures. What's next?  Visit your health care provider once a year for an annual wellness visit.  Ask your health care provider how often you should have your eyes and teeth checked.  Stay up to date on all vaccines. This information is not intended to replace advice given to you by your health care provider. Make sure you discuss any questions you have with your health care provider. Document Revised: 11/18/2019 Document Reviewed: 10/25/2017 Elsevier Patient Education  2021 [place] Breast self-awareness is knowing how your breasts look and feel. Doing breast self-awareness is important. It allows you to catch a breast problem early while it is still small and can be treated. All women should do breast self-awareness, including women who have had breast implants. Tell your doctor if you notice a change in your breasts. What you need:  A mirror.  A well-lit room. How to do a breast self-exam A breast self-exam is one way to learn what is normal for your breasts and to check for changes. To do a breast self-exam: Look for changes 1. Take off all the clothes above your waist. 2. Stand in front of a mirror in a room with good lighting. 3. Put your hands on your hips. 4. Push your hands down. 5. Look at your breasts and nipples in the mirror to see if one breast or nipple looks different from the other. Check to see if: ? The shape of one breast is different. ? The size of  one breast is different. ? There are wrinkles, dips, and bumps in one breast and not the other. 6. Look at each breast for changes in the skin, such as: ? Redness. ? Scaly areas. 7. Look for changes in your nipples, such as: ? Liquid around the nipples. ? Bleeding. ? Dimpling. ? Redness. ? A change in where the nipples are.   Feel for changes 1. Lie on your back on the floor. 2. Feel each breast. To do this, follow these steps: ? Pick a breast to feel. ? Put the arm closest to that breast above your head. ? Use your other arm to feel the nipple area of your breast. Feel the area with the pads of your three middle fingers by making small circles with your fingers. For the first circle, press lightly. For the second circle, press harder. For the third circle, press even harder. ? Keep making circles with your fingers at the different pressures as you move down your breast. Stop when you feel your ribs. ? Move your fingers a little toward the center of your body. ? Start making  circles with your fingers again, this time going up until you reach your collarbone. ? Keep making up-and-down circles until you reach your armpit. Remember to keep using the three pressures. ? Feel the other breast in the same way. 3. Sit or stand in the tub or shower. 4. With soapy water on your skin, feel each breast the same way you did in step 2 when you were lying on the floor.   Write down what you find Writing down what you find can help you remember what to tell your doctor. Write down:  What is normal for each breast.  Any changes you find in each breast, including: ? The kind of changes you find. ? Whether you have pain. ? Size and location of any lumps.  When you last had your menstrual period. General tips  Check your breasts every month.  If you are breastfeeding, the best time to check your breasts is after you feed your baby or after you use a breast pump.  If you get menstrual periods, the best time to check your breasts is 5-7 days after your menstrual period is over.  With time, you will become comfortable with the self-exam, and you will begin to know if there are changes in your breasts. Contact a doctor if you:  See a change in the shape or size of your breasts or nipples.  See a change in the skin of your breast or nipples, such as red or scaly skin.  Have fluid coming from your nipples that is not normal.  Find a lump or thick area that was not there before.  Have pain in your breasts.  Have any concerns about your breast health. Summary  Breast self-awareness includes looking for changes in your breasts, as well as feeling for changes within your breasts.  Breast self-awareness should be done in front of a mirror in a well-lit room.  You should check your breasts every month. If you get menstrual periods, the best time to check your breasts is 5-7 days after your menstrual period is over.  Let your doctor know of any changes you see in your  breasts, including changes in size, changes on the skin, pain or tenderness, or fluid from your nipples that is not normal. This information is not intended to replace advice given to you by your health care provider. Make sure  you discuss any questions you have with your health care provider. Document Revised: 10/02/2017 Document Reviewed: 10/02/2017 Elsevier Patient Education  2021 [place].    Urinary Incontinence  Urinary incontinence refers to a condition in which a person is unable to control where and when to pass urine. A person with this condition will urinate when he or she does not mean to (involuntarily). What are the causes? This condition may be caused by:  Medicines.  Infections.  Constipation.  Overactive bladder muscles.  Weak bladder muscles.  Weak pelvic floor muscles. These muscles provide support for the bladder, intestine, and, in women, the uterus.  Enlarged prostate in men. The prostate is a gland near the bladder. When it gets too big, it can pinch the urethra. With the urethra blocked, the bladder can weaken and lose the ability to empty properly.  Surgery.  Emotional factors, such as anxiety, stress, or post-traumatic stress disorder (PTSD).  Pelvic organ prolapse. This happens in women when organs shift out of place and into the vagina. This shift can prevent the bladder and urethra from working properly. What increases the risk? The following factors may make you more likely to develop this condition:  Older age.  Obesity and physical inactivity.  Pregnancy and childbirth.  Menopause.  Diseases that affect the nerves or spinal cord (neurological diseases).  Long-term (chronic) coughing. This can increase pressure on the bladder and pelvic floor muscles. What are the signs or symptoms? Symptoms may vary depending on the type of urinary incontinence you have. They include:  A sudden urge to urinate, but passing urine involuntarily  before you can get to a bathroom (urge incontinence).  Suddenly passing urine with any activity that forces urine to pass, such as coughing, laughing, exercise, or sneezing (stress incontinence).  Needing to urinate often, but urinating only a small amount, or constantly dribbling urine (overflow incontinence).  Urinating because you cannot get to the bathroom in time due to a physical disability, such as arthritis or injury, or communication and thinking problems, such as Alzheimer disease (functional incontinence). How is this diagnosed? This condition may be diagnosed based on:  Your medical history.  A physical exam.  Tests, such as: ? Urine tests. ? X-rays of your kidney and bladder. ? Ultrasound. ? CT scan. ? Cystoscopy. In this procedure, a health care provider inserts a tube with a light and camera (cystoscope) through the urethra and into the bladder in order to check for problems. ? Urodynamic testing. These tests assess how well the bladder, urethra, and sphincter can store and release urine. There are different types of urodynamic tests, and they vary depending on what the test is measuring. To help diagnose your condition, your health care provider may recommend that you keep a log of when you urinate and how much you urinate. How is this treated? Treatment for this condition depends on the type of incontinence that you have and its cause. Treatment may include:  Lifestyle changes, such as: ? Quitting smoking. ? Maintaining a healthy weight. ? Staying active. Try to get 150 minutes of moderate-intensity exercise every week. Ask your health care provider which activities are safe for you. ? Eating a healthy diet.  Avoid high-fat foods, like fried foods.  Avoid refined carbohydrates like white bread and white rice.  Limit how much alcohol and caffeine you drink.  Increase your fiber intake. Foods such as fresh fruits, vegetables, beans, and whole grains are healthy  sources of fiber.  Pelvic floor muscle  exercises.  Bladder training, such as lengthening the amount of time between bathroom breaks, or using the bathroom at regular intervals.  Using techniques to suppress bladder urges. This can include distraction techniques or controlled breathing exercises.  Medicines to relax the bladder muscles and prevent bladder spasms.  Medicines to help slow or prevent the growth of a man's prostate.  Botox injections. These can help relax the bladder muscles.  Using pulses of electricity to help change bladder reflexes (electrical nerve stimulation).  For women, using a medical device to prevent urine leaks. This is a small, tampon-like, disposable device that is inserted into the urethra.  Injecting collagen or carbon beads (bulking agents) into the urinary sphincter. These can help thicken tissue and close the bladder opening.  Surgery. Follow these instructions at home: Lifestyle  Limit alcohol and caffeine. These can fill your bladder quickly and irritate it.  Keep yourself clean to help prevent odors and skin damage. Ask your doctor about special skin creams and cleansers that can protect the skin from urine.  Consider wearing pads or adult diapers. Make sure to change them regularly, and always change them right after experiencing incontinence. General instructions  Take over-the-counter and prescription medicines only as told by your health care provider.  Use the bathroom about every 3-4 hours, even if you do not feel the need to urinate. Try to empty your bladder completely every time. After urinating, wait a minute. Then try to urinate again.  Make sure you are in a relaxed position while urinating.  If your incontinence is caused by nerve problems, keep a log of the medicines you take and the times you go to the bathroom.  Keep all follow-up visits as told by your health care provider. This is important. Contact a health care provider  if:  You have pain that gets worse.  Your incontinence gets worse. Get help right away if:  You have a fever or chills.  You are unable to urinate.  You have redness in your groin area or down your legs. Summary  Urinary incontinence refers to a condition in which a person is unable to control where and when to pass urine.  This condition may be caused by medicines, infection, weak bladder muscles, weak pelvic floor muscles, enlargement of the prostate (in men), or surgery.  The following factors increase your risk for developing this condition: older age, obesity, pregnancy and childbirth, menopause, neurological diseases, and chronic coughing.  There are several types of urinary incontinence. They include urge incontinence, stress incontinence, overflow incontinence, and functional incontinence.  This condition is usually treated first with lifestyle and behavioral changes, such as quitting smoking, eating a healthier diet, and doing regular pelvic floor exercises. Other treatment options include medicines, bulking agents, medical devices, electrical nerve stimulation, or surgery. This information is not intended to replace advice given to you by your health care provider. Make sure you discuss any questions you have with your health care provider. Document Revised: 02/23/2017 Document Reviewed: 05/25/2016 Elsevier Patient Education  [place].

## 2020-06-09 ENCOUNTER — Encounter: Payer: Self-pay | Admitting: Obstetrics and Gynecology

## 2020-06-09 ENCOUNTER — Other Ambulatory Visit: Payer: Self-pay

## 2020-06-09 ENCOUNTER — Ambulatory Visit (INDEPENDENT_AMBULATORY_CARE_PROVIDER_SITE_OTHER): Payer: BC Managed Care – PPO | Admitting: Obstetrics and Gynecology

## 2020-06-09 VITALS — BP 125/73 | HR 85 | Ht 66.5 in | Wt 120.0 lb

## 2020-06-09 DIAGNOSIS — D229 Melanocytic nevi, unspecified: Secondary | ICD-10-CM

## 2020-06-09 DIAGNOSIS — N393 Stress incontinence (female) (male): Secondary | ICD-10-CM | POA: Diagnosis not present

## 2020-06-09 DIAGNOSIS — M810 Age-related osteoporosis without current pathological fracture: Secondary | ICD-10-CM

## 2020-06-09 DIAGNOSIS — Z01419 Encounter for gynecological examination (general) (routine) without abnormal findings: Secondary | ICD-10-CM

## 2020-06-09 DIAGNOSIS — Z780 Asymptomatic menopausal state: Secondary | ICD-10-CM

## 2020-06-09 DIAGNOSIS — N952 Postmenopausal atrophic vaginitis: Secondary | ICD-10-CM

## 2020-06-09 NOTE — Progress Notes (Signed)
Annual Exam-Pt stated when she cough/laugh/run she has noticed bladder leaking. Pt was like to discuss physical therapy for her symptoms.

## 2020-06-09 NOTE — Progress Notes (Signed)
ANNUAL PREVENTATIVE CARE GYNECOLOGY  ENCOUNTER NOTE  Subjective:       Caroline Harmon is a 60 y.o. G4P2002 female here for a routine annual gynecologic exam. The patient is sexually active. The patient has never been on hormone replacement therapy. Patient denies post-menopausal vaginal bleeding. The patient wears seatbelts: yes. The patient participates in regular exercise: yes (walking daily up to 5 miles). Has the patient ever been transfused or tattooed?: no  Current complaints: 1.  Reports leaking of urine with exercise (mostly running, but sometimes walking or doing jumping jacks). Does not happen all the time.  Does not use pads or diapers.  Is conscious of crossing legs before coughing or sneezing to help.    Gynecologic History No LMP recorded. Patient is postmenopausal. Contraception: post menopausal status Last Pap: 06/05/2019. Results were: normal. Does have h/o mildly abnormal pap.  Last mammogram: 04/11/2019. Results were: normal Last Colonoscopy: 2015.  Was normal. Repeat in 10 years.  Last Dexa Scan: 12/24/2019.  Osteoporosis.  T score is - 2.9.   Obstetric History OB History  Gravida Para Term Preterm AB Living  2 2 2     2   SAB IAB Ectopic Multiple Live Births          2    # Outcome Date GA Lbr Len/2nd Weight Sex Delivery Anes PTL Lv  2 Term 05/14/89    M Vag-Spont   LIV  1 Term 05/29/87    F Vag-Spont   LIV    Past Medical History:  Diagnosis Date  . 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 Migraine   . Osteoporosis     Family History  Problem Relation Age of Onset  . Migraines Mother   . Heart attack Father   . Cancer Father        skin  . Hypertension Father   . Breast cancer Neg Hx     Past Surgical History:  Procedure Laterality Date  . CAROTID BODY TUMOR EXCISION     benign  . COLONOSCOPY  10/06/13    Social History   Socioeconomic History  . Marital status: Married    Spouse name: Gershon Mussel  . Number of children: 1   . Years of education: 39  . Highest education level: Bachelor's degree (e.g., BA, AB, BS)  Occupational History  . Not on file  Tobacco Use  . Smoking status: Never Smoker  . Smokeless tobacco: Never Used  Vaping Use  . Vaping Use: Never used  Substance and Sexual Activity  . Alcohol use: Yes    Comment: occassional  . Drug use: No  . Sexual activity: Yes    Birth control/protection: None, Post-menopausal  Other Topics Concern  . Not on file  Social History Narrative  . Not on file   Social Determinants of Health   Financial Resource Strain: Low Risk   . Difficulty of Paying Living Expenses: Not hard at all  Food Insecurity: No Food Insecurity  . Worried About Charity fundraiser in the Last Year: Never true  . Ran Out of Food in the Last Year: Never true  Transportation Needs: No Transportation Needs  . Lack of Transportation (Medical): No  . Lack of Transportation (Non-Medical): No  Physical Activity: Sufficiently Active  . Days of Exercise per Week: 5 days  . Minutes of Exercise per Session: 60 min  Stress: No Stress Concern Present  . Feeling of Stress : Not at  all  Social Connections: Socially Integrated  . Frequency of Communication with Friends and Family: More than three times a week  . Frequency of Social Gatherings with Friends and Family: More than three times a week  . Attends Religious Services: 1 to 4 times per year  . Active Member of Clubs or Organizations: Yes  . Attends Archivist Meetings: 1 to 4 times per year  . Marital Status: Married  Human resources officer Violence: Not At Risk  . Fear of Current or Ex-Partner: No  . Emotionally Abused: No  . Physically Abused: No  . Sexually Abused: No    Current Outpatient Medications on File Prior to Visit  Medication Sig Dispense Refill  . aspirin EC 81 MG tablet Take 1 tablet (81 mg total) by mouth daily.    . Calcium-Vitamin D-Vitamin K (CALCIUM SOFT CHEWS) [phone] MG-UNT-MCG CHEW     .  eletriptan (RELPAX) 40 MG tablet May repeat in 2 hours if headache persists or recurs. 10 tablet 11  . Multiple Vitamins-Minerals (IMMUNE SUPPORT VITAMIN C PO) Take by mouth.     No current facility-administered medications on file prior to visit.    Allergies  Allergen Reactions  . Topamax [Topiramate] Other (See Comments)    Hematuria      Review of Systems ROS Review of Systems - General ROS: negative for - chills, fatigue, fever, hot flashes, night sweats, weight gain or weight loss Psychological ROS: negative for - anxiety, decreased libido, depression, mood swings, physical abuse or sexual abuse Ophthalmic ROS: negative for - blurry vision, eye pain or loss of vision ENT ROS: negative for - headaches, hearing change, visual changes or vocal changes Allergy and Immunology ROS: negative for - hives, itchy/watery eyes or seasonal allergies Hematological and Lymphatic ROS: negative for - bleeding problems, bruising, swollen lymph nodes or weight loss Endocrine ROS: negative for - galactorrhea, hair pattern changes, hot flashes, malaise/lethargy, mood swings, palpitations, polydipsia/polyuria, skin changes, temperature intolerance or unexpected weight changes Breast ROS: negative for - new or changing breast lumps or nipple discharge Respiratory ROS: negative for - cough or shortness of breath Cardiovascular ROS: negative for - chest pain, irregular heartbeat, palpitations or shortness of breath Gastrointestinal ROS: no abdominal pain, change in bowel habits, or black or bloody stools Genito-Urinary ROS: no dysuria, hematuria, or vaginal bleeding. Positive for urinary leaking with exercise.  Musculoskeletal ROS: negative for - joint pain or joint stiffness Neurological ROS: negative for - bowel and bladder control changes Dermatological ROS: negative for rash and skin lesion changes   Objective:   BP 125/73   Pulse 85   Ht 5' 6.5" (1.689 m)   Wt 120 lb (54.4 kg)   BMI 19.08  kg/m  CONSTITUTIONAL: Well-developed, well-nourished female in no acute distress.  PSYCHIATRIC: Normal mood and affect. Normal behavior. Normal judgment and thought content. [place]: Alert and oriented to person, place, and time. Normal muscle tone coordination. No cranial nerve deficit noted. HENT:  Normocephalic, atraumatic, External right and left ear normal. Oropharynx is clear and moist EYES: Conjunctivae and EOM are normal. Pupils are equal, round, and reactive to light. No scleral icterus.  NECK: Normal range of motion, supple, no masses.  Normal thyroid.  SKIN: Skin is warm and dry. No rash noted. Not diaphoretic. No erythema. No pallor. Multiple benign-appearing small skin moles in generalized distribution along chest and trunk. Also with few Carianne Taira angiomas.  CARDIOVASCULAR: Normal heart rate noted, regular rhythm, no murmur. RESPIRATORY: Clear to auscultation bilaterally.  Effort and breath sounds normal, no problems with respiration noted. BREASTS: Symmetric in size. No masses, skin changes, nipple drainage, or lymphadenopathy. ABDOMEN: Soft, normal bowel sounds, no distention noted.  No tenderness, rebound or guarding.  BLADDER: Normal PELVIC:  Bladder no bladder distension noted  Urethra: normal appearing urethra with no masses, tenderness or lesions  Vulva: normal appearing vulva with no masses, tenderness or lesions  Vagina: normal appearing vagina with no discharge, no lesions. Mild vaginal atrophy. No cystocele or rectocele.   Cervix: normal appearing cervix without discharge or lesions  Uterus: uterus is normal size, shape, consistency and nontender  Adnexa: normal adnexa in size, nontender and no masses  RV: External Exam NormaI, No Rectal Masses and Normal Sphincter tone  MUSCULOSKELETAL: Normal range of motion. No tenderness.  No cyanosis, clubbing, or edema.  2+ distal pulses. LYMPHATIC: No Axillary, Supraclavicular, or Inguinal Adenopathy.   Labs: Lab Results   Component Value Date   WBC 5.7 01/26/2020   HGB 13.4 01/26/2020   HCT 40.4 01/26/2020   MCV 92.4 01/26/2020   PLT 312 01/26/2020    Lab Results  Component Value Date   CREATININE 0.89 01/26/2020   BUN 25 01/26/2020   NA 140 01/26/2020   K 4.3 01/26/2020   CL 102 01/26/2020   CO2 30 01/26/2020    Lab Results  Component Value Date   ALT 27 01/26/2020   AST 34 01/26/2020   ALKPHOS 69 12/07/2015   BILITOT 0.6 01/26/2020    Lab Results  Component Value Date   CHOL 195 01/26/2020   HDL 86 01/26/2020   LDLCALC 90 01/26/2020   TRIG 101 01/26/2020   CHOLHDL 2.3 01/26/2020    Lab Results  Component Value Date   TSH 1.76 12/13/2017    Lab Results  Component Value Date   CHOL 195 01/26/2020   HDL 86 01/26/2020   LDLCALC 90 01/26/2020   TRIG 101 01/26/2020   CHOLHDL 2.3 01/26/2020     Assessment:   Annual gynecologic examination 60 y.o. Normal BMI  Vaginal atrophy Menopause Osteoporosis Skin moles Stress urinary incontinence  Plan:  - Pap: up to date. - Mammogram: Ordered - Stool Guaiac Testing:  Not Ordered. Up to date on colonosocopy.  - Osteoporosis, patient reluctant to take prescription meds. Is taking Viactive and doing weight-bearing exercises. Since last scan, minimal change. PCP ok with current regimen.  - Labs: reviewed. Performed by PCP.  - Routine preventative health maintenance measures emphasized: Exercise/Diet/Weight control, Alcohol/Substance use risks and Stress Management.  - Currently on Vitamin D and calcium for osteoporosis management.  - Vaginal atrophy and menopause asymptomatic.  - Follows up yearly with Dermatologist (routine follow up) for skin moles.  - Discussed treatment options for stress urinary incontinence, including OTC Poise Impressa vaginal inserts, pelvic floor physical therapy, or pessary use. Symptoms not severe enough for surgical intervention at this time. Patient notes she will try OTC vaginal inserts first. To f/u  if symptoms persist or worsen.  - Return to [place], MD  [facility]

## 2020-06-24 ENCOUNTER — Ambulatory Visit
Admission: RE | Admit: 2020-06-24 | Discharge: 2020-06-24 | Disposition: A | Discharge status: Home / Self Care | Payer: BC Managed Care – PPO | Source: Ambulatory Visit | Attending: Family Medicine | Admitting: Family Medicine

## 2020-06-24 ENCOUNTER — Other Ambulatory Visit: Payer: Self-pay

## 2020-06-24 DIAGNOSIS — Z1231 Encounter for screening mammogram for malignant neoplasm of breast: Secondary | ICD-10-CM | POA: Insufficient documentation

## 2020-07-05 ENCOUNTER — Ambulatory Visit: Payer: BC Managed Care – PPO | Attending: Obstetrics and Gynecology | Admitting: Physical Therapy

## 2020-07-05 ENCOUNTER — Encounter: Payer: Self-pay | Admitting: Physical Therapy

## 2020-07-05 ENCOUNTER — Other Ambulatory Visit: Payer: Self-pay

## 2020-07-05 DIAGNOSIS — M6281 Muscle weakness (generalized): Secondary | ICD-10-CM | POA: Insufficient documentation

## 2020-07-05 DIAGNOSIS — R278 Other lack of coordination: Secondary | ICD-10-CM | POA: Insufficient documentation

## 2020-07-05 NOTE — Therapy (Signed)
[facility] [address] [place], [place], [address] Phone: [phone]   Fax:  [phone]  Physical Therapy Evaluation  Patient Details  Name: Caroline Harmon MRN: [id number] Date of Birth: 12-Dec-1960 Referring Provider (PT): [name]   Encounter Date: 07/05/2020   PT End of Session - 07/05/20 1630    Visit Number 1    Number of Visits 8    Date for PT Re-Evaluation 08/30/20    PT Start Time 1630    PT Stop Time 1710    PT Time Calculation (min) 40 min    Activity Tolerance Patient tolerated treatment well    Behavior During Therapy [facility] for tasks assessed/performed           Past Medical History:  Diagnosis Date  . 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 Migraine   . Osteoporosis     Past Surgical History:  Procedure Laterality Date  . CAROTID BODY TUMOR EXCISION     benign  . COLONOSCOPY  10/06/13    There were no vitals filed for this visit.        [facility] PT Assessment - 07/05/20 0001      Assessment   Medical Diagnosis SUI    Referring Provider (PT) Marcelline Mates, Anika    Hand Dominance Right    Prior Therapy None for this dx      Balance Screen   Has the patient fallen in the past 6 months No            PELVIC HEALTH PHYSICAL THERAPY EVALUATION  SCREENING Red Flags: none Have you had any night sweats? Unexplained weight loss? Saddle anesthesia? Unexplained changes in bowel or bladder habits?  Precautions: Osteoporosis (femur)  SUBJECTIVE  Chief Complaint: Patient notes that she has UI with physical activity (running/walking at increased speed). Patient is able to do household chores (vacuuming) without issue/complaint. Patient reports that walking on the treadmill at 4.1 mph at incline of 3.0% with no issue. Patient notes recent bout of UI with horseback riding at gallop. Patient has scaled back on her more rigorous activities for fear of UI and because of bone density  concerns. Patient did not have any leakage issue after childbearing. 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  Pertinent History:  Falls Negative.  Scoliosis Negative. Pulmonary disease/dysfunction Negative. Surgical history: Negative.   Obstetrical History: G2P2 Deliveries: SVD Tearing/Episiotomy: episiotomy x2 Birthing position: back  Gynecological History: Hysterectomy: No  Pelvic Organ Prolapse: Negative Pain with exam: No Heaviness/pressure: No    Urinary History: Incontinence: Negative. Onset: 10 years ago Triggers: impact activities; coughing/sneezing. Amount: Min. Protective undergarments: Yes  Type: pantyliner  Number used/day: 0-1x (for the activity) Fluid Intake: 32 oz H20, 1 cup coffee caffeinated, glass of wine, no juices/sodas Nocturia: 0-1x/night Frequency of urination: every 2 hours Pain with urination: Negative Difficulty initiating urination: Negative Intermittent stream: Negative Frequent UTI: Negative.   Gastrointestinal History: [place] Stool Chart: Type 4 (67%), Type 1 (33%) Frequency of BMs: 2-3x/week (baseline) Pain with defecation: Negative Straining with defecation: Positive for occasional (1x/week) Hemorrhoids: Negative Incontinence: Negative.   Sexual activity/pain: Pain with intercourse: Negative.   Initial penetration: No  Deep thrusting: No External stimulation: No   Location of pain: n/a  Patient assessment of present state: "The muscles are weak"  Current activities:  Walking, would like to return to jogging, tennis, horseback riding  with improved UI and bone denisty  Patient Goals:  "be able to run without leaking" "be able to walk without leaking" "Be able to cough/sneeze without leaking"  Patient perception of overall health: Excellent  OBJECTIVE  Mental Status Patient is oriented to person, place and time.  Recent memory is intact.  Remote memory is intact.  Attention span  and concentration are intact.  Expressive speech is intact.  Patient's fund of knowledge is within normal limits for educational level.  POSTURE/OBSERVATIONS:  Lumbar lordosis: diminished Thoracic kyphosis: increased in curve and length Patient sits with consistent hip adduction and posterior pelvic til as well as spinal rotation R.  GAIT: No apparent deficits. Grossly WFL  RANGE OF MOTION: deferred 2/2 to time constraints   LEFT RIGHT  Lumbar forward flexion (65):      Lumbar extension (30):     Lumbar lateral flexion (25):     Thoracic and Lumbar rotation (30 degrees):       Hip Flexion (0-125):      Hip IR (0-45):     Hip ER (0-45):     Hip Abduction (0-40):     Hip extension (0-15):      STRENGTH: MMT deferred 2/2 to time constraints  RLE LLE  Hip Flexion    Hip Extension    Hip Abduction     Hip Adduction     Hip ER     Hip IR     Knee Extension    Knee Flexion    Dorsiflexion     Plantarflexion (seated)     ABDOMINAL: deferred 2/2 to time constraints Palpation: Diastasis: Scar mobility: Rib flare:  SPECIAL TESTS: deferred 2/2 to time constraints  PHYSICAL PERFORMANCE MEASURES: STS: WNL   EXTERNAL PELVIC EXAM: deferred 2/2 to time constraints Palpation: Breath coordination: Cued Lengthen: Cued Contraction: Cough:  INTERNAL VAGINAL EXAM: deferred 2/2 to time constraints Introitus Appears:  Skin integrity:  Scar mobility: Strength (PERF):  Symmetry: Palpation: Prolapse:   INTERNAL RECTAL EXAM: not indicated Strength (PERF): Symmetry: Palpation: Prolapse:   OUTCOME MEASURES: FOTO (Urinary 58)   ASSESSMENT Patient is a 60 year old presenting to clinic with chief complaints of urinary leakage with impact activities and coughing/sneezing. Evaluation is suggestive of deficits in posture, PFM coordination, PFM strength, and IAP management as evidenced by significant posterior pelvic tilt with B hip adduction in sitting, accidental urinary  leakage with running, walking up inclines, coughing/sneezing. Patient's responses on FOTO outcome measures (Urinary 58) indicate moderate functional limitations/disability/distress. Patient's progress may be limited due to persistent of symptoms; however, patient's motivation is advantageous. Patient was able to achieve basic understanding of PFM function during today's evaluation and responded positively to educational interventions. Patient will benefit from continued skilled therapeutic intervention to address deficits in posture, PFM coordination, PFM strength, and IAP management in order to increase function and improve overall QOL.  EDUCATION Patient educated on prognosis and POC. Patient articulated understanding and returned demonstration. Patient will benefit from further education in order to maximize compliance and understanding for long-term therapeutic gains.  TREATMENT Neuromuscular Re-education: Patient educated on primary functions of the pelvic floor including: posture/balance, sexual pleasure, storage and elimination of waste from the body, abdominal cavity closure, and breath coordination.    Objective measurements completed on examination: See above findings.        PT Long Term Goals - 07/05/20 1818      PT LONG TERM GOAL #1   Title Patient will demonstrate independence with HEP in  order to maximize therapeutic gains and improve carryover from physical therapy sessions to ADLs in the home and community.    Baseline IE: not initiated    Time 8    Period Weeks    Status New    Target Date 08/30/20      PT LONG TERM GOAL #2   Title Patient will demonstrate circumferential and sequential contraction of >4/5 MMT, > 6 sec hold x10 and 5 consecutive quick flicks with </= 10 min rest between testing bouts, and relaxation of the PFM coordinated with breath for improved management of intra-abdominal pressure and normal bowel and bladder function without the presence of pain nor  incontinence in order to improve participation at home and in the community.    Baseline IE: not demonstrated    Time 8    Period Weeks    Status New    Target Date 08/30/20      PT LONG TERM GOAL #3   Title Patient will demonstrate coordinated lengthening and relaxation of PFM with diaphragmatic inhalation in order to decrease spasm and allow for unrestricted elimination of urine/feces for improved overall QOL.    Baseline IE: not demonstrate    Time 8    Period Weeks    Status New    Target Date 08/30/20      PT LONG TERM GOAL #4   Title Patient will report less than 25% incidence of stress urinary incontinence over the course of 3 weeks while coughing/sneezing/laughing/prolonged activity in order to demonstrate improved PFM coordination, strength, and function for improved overall QOL.    Baseline IE: 100%    Time 8    Period Weeks    Status New    Target Date 08/30/20      PT LONG TERM GOAL #5   Title Patient will demonstrate improved function as evidenced by a score of 67 on FOTO measure for full participation in activities at home and in the community.    Baseline IE: 73    Time 8    Period Weeks    Status New    Target Date 08/30/20                  Plan - 07/05/20 1631    Clinical Impression Statement Patient is a 60 year old presenting to clinic with chief complaints of urinary leakage with impact activities and coughing/sneezing. Evaluation is suggestive of deficits in posture, PFM coordination, PFM strength, and IAP management as evidenced by significant posterior pelvic tilt with B hip adduction in sitting, accidental urinary leakage with running, walking up inclines, coughing/sneezing. Patient's responses on FOTO outcome measures (Urinary 58) indicate moderate functional limitations/disability/distress. Patient's progress may be limited due to persistent of symptoms; however, patient's motivation is advantageous. Patient was able to achieve basic understanding  of PFM function during today's evaluation and responded positively to educational interventions. Patient will benefit from continued skilled therapeutic intervention to address deficits in posture, PFM coordination, PFM strength, and IAP management in order to increase function and improve overall QOL.    Personal Factors and Comorbidities Age;Comorbidity 2;Sex;Behavior Pattern;Time since onset of injury/illness/exacerbation    Comorbidities migraine, osteoporosis,    Examination-Activity Limitations Continence    Examination-Participation Restrictions Community Activity;Yard Work    Merchant navy officer Evolving/Moderate complexity    Clinical Decision Making Moderate    Rehab Potential Good    PT Frequency 1x / week    PT Duration 8 weeks    PT Treatment/Interventions ADLs/Self Care  Home Management;Biofeedback;Cryotherapy;Electrical Stimulation;Iontophoresis 4mg /ml Dexamethasone;Moist Heat;Therapeutic exercise;Neuromuscular re-education;Patient/family education;Orthotic Fit/Training;Manual techniques;Scar mobilization;Dry needling;Taping;Spinal Manipulations;Joint Manipulations    PT Next Visit Plan physical assessment    PT Home Exercise Plan none provided    Consulted and Agree with Plan of Care Patient           Patient will benefit from skilled therapeutic intervention in order to improve the following deficits and impairments:  Decreased endurance,Improper body mechanics,Postural dysfunction,Decreased strength,Decreased activity tolerance,Decreased coordination  Visit Diagnosis: Other lack of coordination  Muscle weakness (generalized)     Problem List Patient Active Problem List   Diagnosis Date Noted  . Benign skin mole 01/31/2018  . 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 Seborrheic keratosis 01/13/2016  . [place] angioma 01/13/2016  . Preventative health care 11/12/2014  . Migraine   . Osteoporosis   . Benign tumor of carotid body     Myles Gip PT, DPT [phone]  07/05/2020, 6:21 PM  [facility] [address] [place], [place], [address] Phone: [phone]   Fax:  [phone]  Name: Tomeshia Pizzi MRN: [id number] Date of Birth: [birth year]

## 2020-08-10 IMAGING — MG DIGITAL SCREENING BILAT W/ TOMO W/ CAD
8 series · 9 of 24 positions shown · non-contrast
Comparison: Previous exam(s).

CLINICAL DATA: Screening.

EXAM:
DIGITAL SCREENING BILATERAL MAMMOGRAM WITH TOMO AND CAD

[L MLO synth-2D]
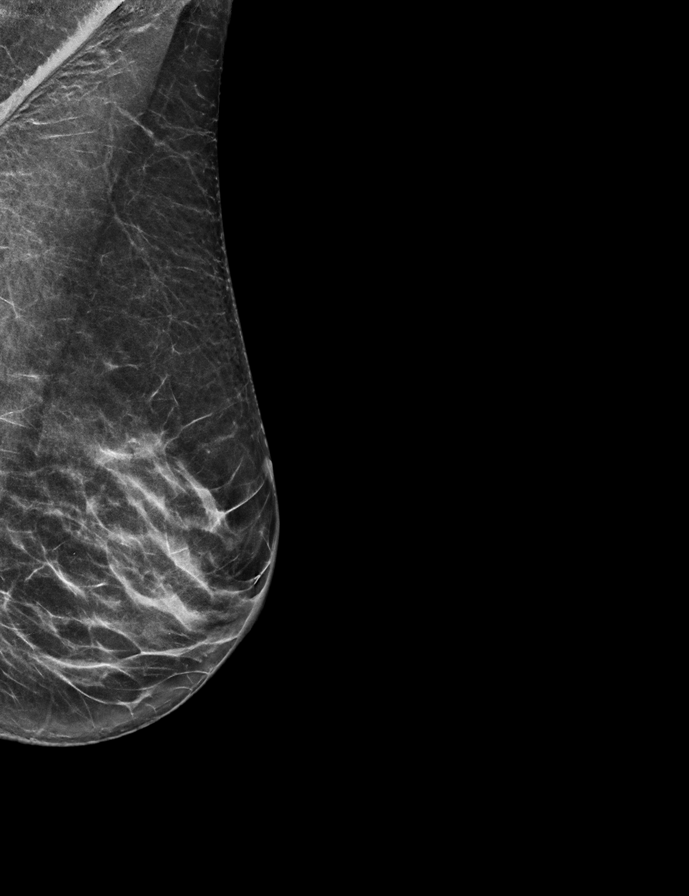

[L CC synth-2D]
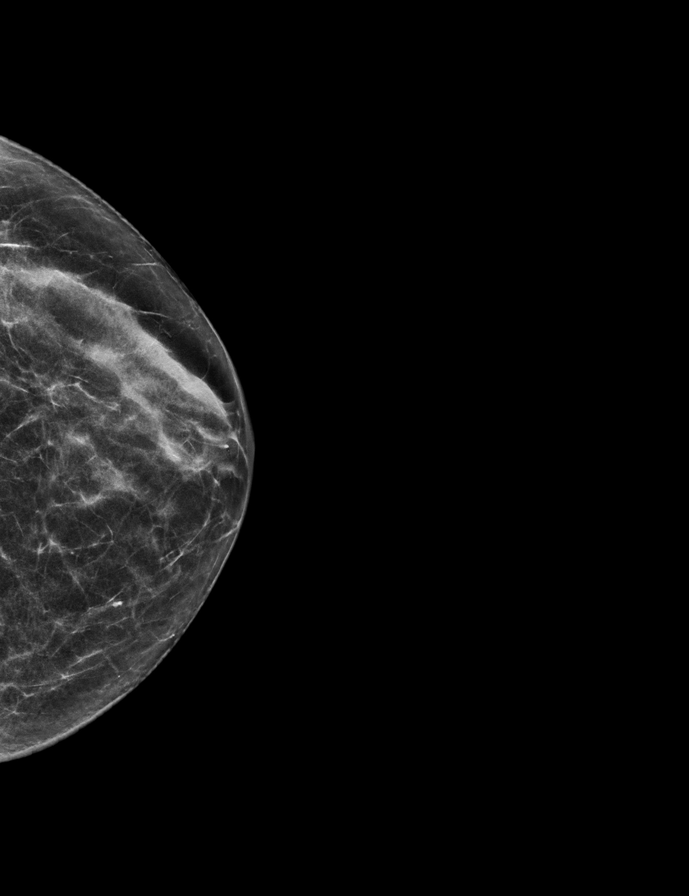

[R MLO synth-2D]
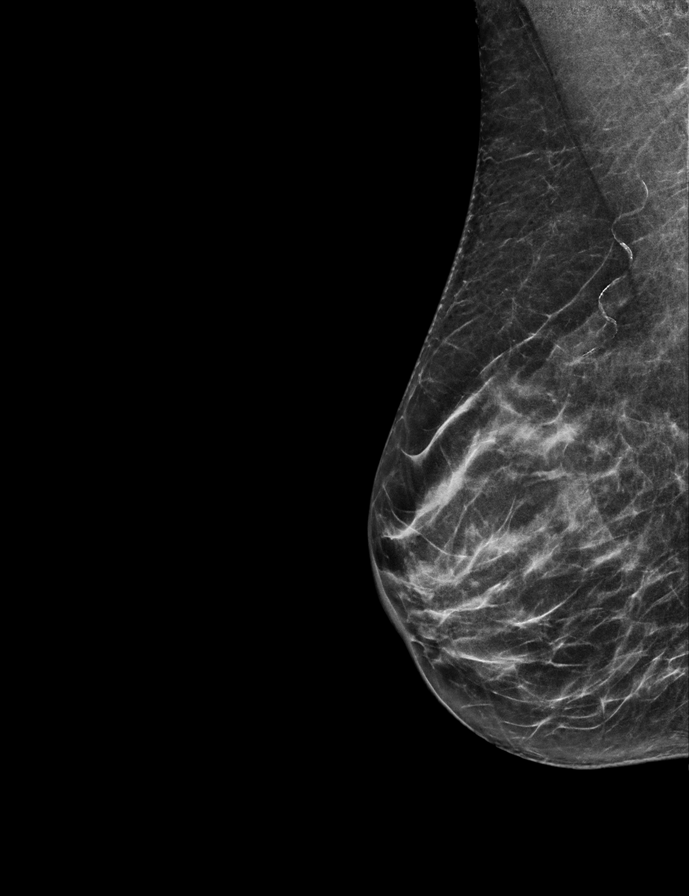

[R CC synth-2D]
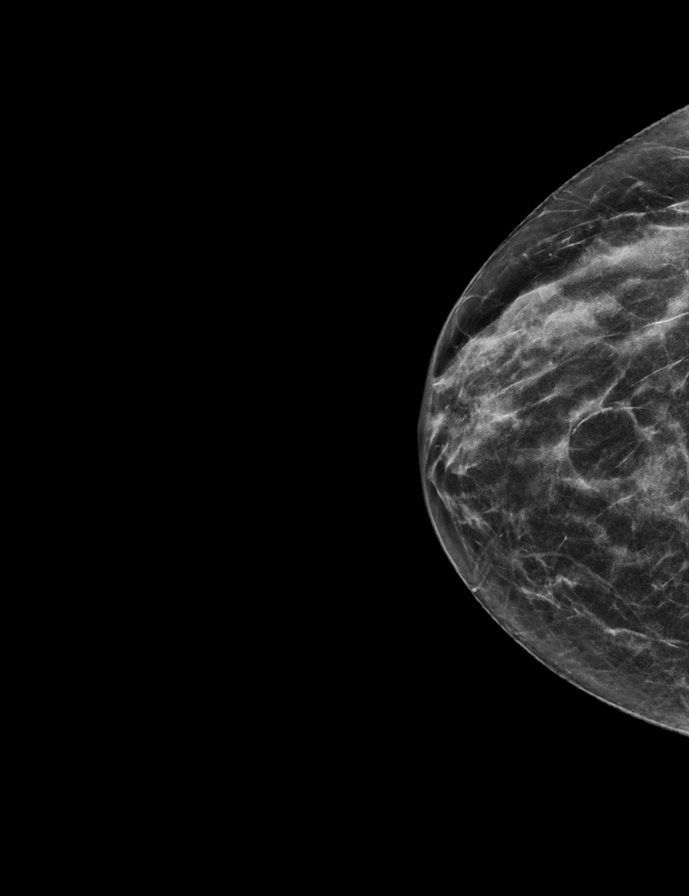

[R CC tomo · 2 of 50 frames shown]
[frame 17/50]
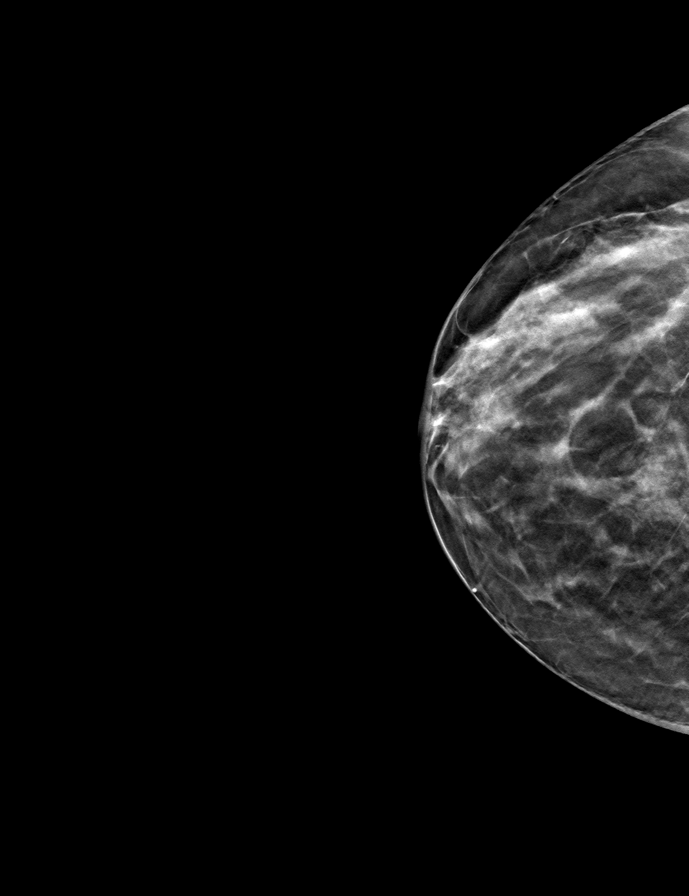
[frame 25/50]
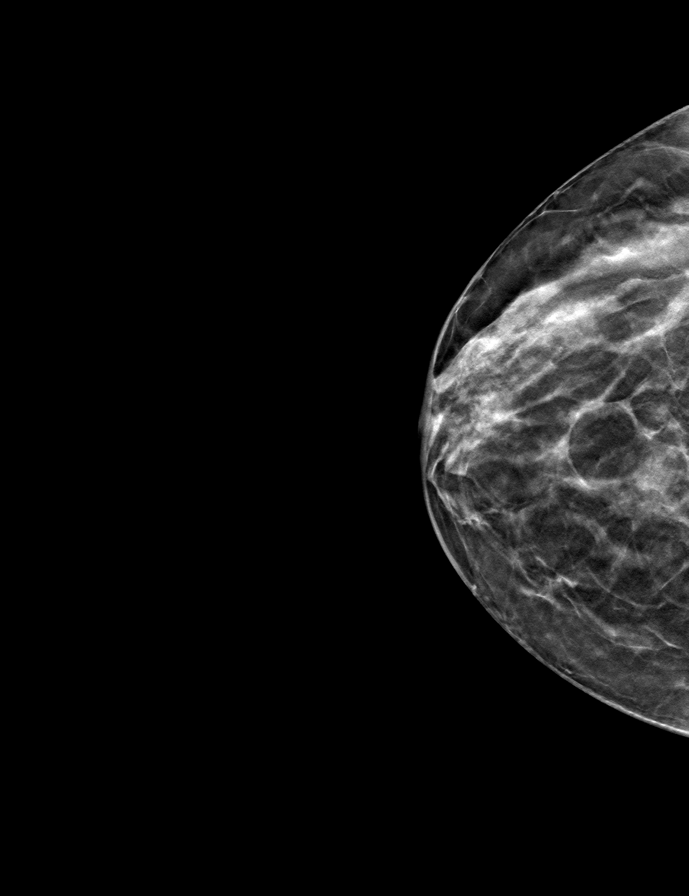

[L CC tomo · tomo slice 27/52.0]
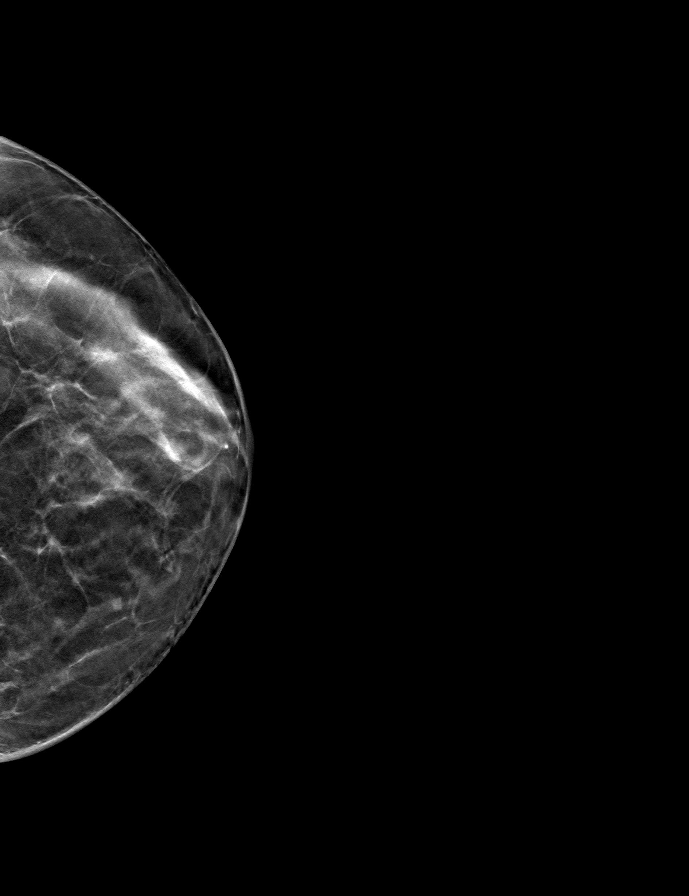

[R MLO tomo · tomo slice 27/54.0]
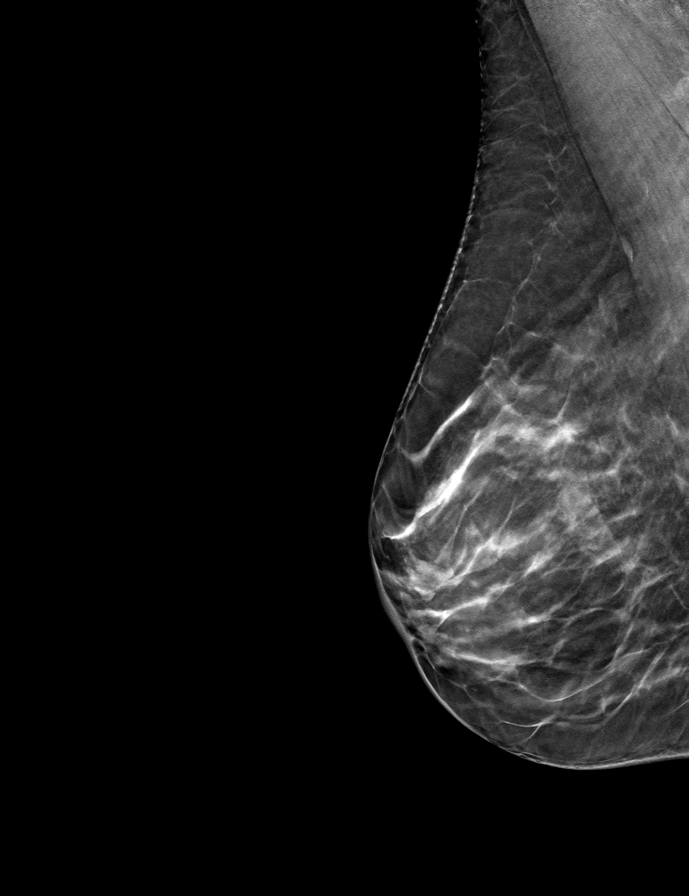

[L MLO tomo · tomo slice 29/57.0]
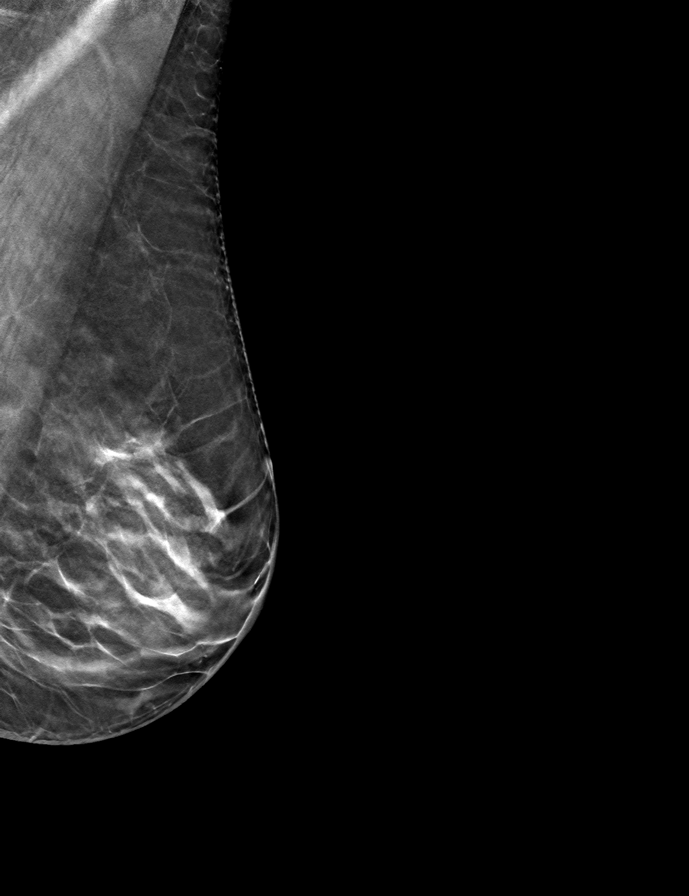

[9 of 24 positions shown; findings below may reference images not displayed]

ACR Breast Density Category c: The breast tissue is heterogeneously
dense, which may obscure small masses.
FINDINGS: There are no findings suspicious for malignancy. Images were
processed with CAD.
IMPRESSION: No mammographic evidence of malignancy. A result letter of this
screening mammogram will be mailed directly to the patient.

RECOMMENDATION:
Screening mammogram in one year. (Code:FT-U-LHB)

BI-RADS CATEGORY  1: Negative.

## 2020-08-12 ENCOUNTER — Encounter: Payer: Self-pay | Admitting: Family Medicine

## 2020-08-16 ENCOUNTER — Other Ambulatory Visit: Payer: Self-pay

## 2020-08-16 ENCOUNTER — Ambulatory Visit: Payer: BC Managed Care – PPO | Attending: Obstetrics and Gynecology | Admitting: Physical Therapy

## 2020-08-16 ENCOUNTER — Ambulatory Visit: Payer: BC Managed Care – PPO | Admitting: Physical Therapy

## 2020-08-16 ENCOUNTER — Encounter: Payer: Self-pay | Admitting: Physical Therapy

## 2020-08-16 DIAGNOSIS — M6281 Muscle weakness (generalized): Secondary | ICD-10-CM | POA: Diagnosis present

## 2020-08-16 DIAGNOSIS — R278 Other lack of coordination: Secondary | ICD-10-CM | POA: Diagnosis present

## 2020-08-16 NOTE — Therapy (Signed)
[facility] [address]. [place], [place], [address] Phone: [phone]   Fax:  [phone]  Physical Therapy Treatment  Patient Details  Name: Caroline Harmon MRN: [id number] Date of Birth: 1961-01-09 Referring Provider (PT): [name]   Encounter Date: 08/16/2020   PT End of Session - 08/16/20 0854     Visit Number 2    Number of Visits 8    Date for PT Re-Evaluation 08/30/20    PT Start Time 0845    PT Stop Time 0940    PT Time Calculation (min) 55 min    Activity Tolerance Patient tolerated treatment well    Behavior During Therapy [facility] for tasks assessed/performed             Past Medical History:  Diagnosis Date   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   Subjective Assessment - 08/16/20 0847     Subjective Patient notes that she has not had any leakage since her evaluation. Patient has not been running since evaluation either; patient did notice when going up stairs quickly that she could start to sense/predict a loss of control. Patient was able to contorl by changing pace.    Currently in Pain? No/denies            TREATMENT  Pre-treatment assessment: RANGE OF MOTION:    LEFT RIGHT  Lumbar forward flexion (65):  WNL    Lumbar extension (30): WNL    Lumbar lateral flexion (25):  WNL WNL  Thoracic and Lumbar rotation (30 degrees):    WNL WNL  Hip Flexion (0-125):   WNL WNL  Hip IR (0-45):  WNL WNL  Hip ER (0-45):  WNL WNL  Hip Abduction (0-40):  WNL WNL  Hip extension (0-15):  WNL WNL    SENSATION: Grossly intact to light touch bilateral LEs as determined by testing dermatomes L2-S2 Proprioception and hot/cold testing deferred on this date  STRENGTH: MMT   RLE LLE  Hip  Flexion 5 5  Hip Extension 5 5  Hip Abduction  5 5  Hip Adduction  5 5  Hip ER  5 5  Hip IR  3+ 5  Knee Extension 5 5  Knee Flexion 5 5  Dorsiflexion  5 5  Plantarflexion (seated) 5 5   ABDOMINAL:  Palpation: no TTP Diastasis: none Scar mobility: not applicable Rib flare: minimal/moderate  SPECIAL TESTS: Stork/March (SP 93): R: Negative L: Negative  EXTERNAL PELVIC EXAM: Patient educated on the purpose of the pelvic exam and articulated understanding; patient consented to the exam verbally. Palpation: no TTP Breath coordination: present with cueing Cued Lengthen: anterior pelvic til strategy, PFM movement absent Cued Contraction: hip adductor strategy, PFM movement absent Cough: perineal lift  Neuromuscular Re-education: Supine hooklying diaphragmatic breathing with VCs and TCs for downregulation of the nervous system and improved management of IAP Supine hooklying, PFM lengthening with inhalation. VCs and TCs to decrease compensatory patterns and encourage optimal relaxation of the PFM. Patient education on typical coordination of PFM with diaphragmatic breathing and the importance of high rep for training PFM proprioception. Standing hip adductor stretch for decreased overactivity with PFM activation.  Patient educated throughout session on  appropriate technique and form using multi-modal cueing, HEP, and activity modification. Patient articulated understanding and returned demonstration.  Patient Response to interventions: Return in 1 week for progression.  ASSESSMENT Patient presents to clinic with excellent motivation to participate in therapy. Patient demonstrates deficits in posture, PFM coordination, PFM strength, and IAP management. Patient able to achieve diaphragmatic breath with PFM length with moderate cueing during today's session and responded positively to educational interventions. Patient will benefit from continued skilled therapeutic intervention to address  remaining deficits in posture, PFM coordination, PFM strength, and IAP management in order to increase function and improve overall QOL.     PT Long Term Goals - 07/05/20 1818       PT LONG TERM GOAL #1   Title Patient will demonstrate independence with HEP in order to maximize therapeutic gains and improve carryover from physical therapy sessions to ADLs in the home and community.    Baseline IE: not initiated    Time 8    Period Weeks    Status New    Target Date 08/30/20      PT LONG TERM GOAL #2   Title Patient will demonstrate circumferential and sequential contraction of >4/5 MMT, > 6 sec hold x10 and 5 consecutive quick flicks with </= 10 min rest between testing bouts, and relaxation of the PFM coordinated with breath for improved management of intra-abdominal pressure and normal bowel and bladder function without the presence of pain nor incontinence in order to improve participation at home and in the community.    Baseline IE: not demonstrated    Time 8    Period Weeks    Status New    Target Date 08/30/20      PT LONG TERM GOAL #3   Title Patient will demonstrate coordinated lengthening and relaxation of PFM with diaphragmatic inhalation in order to decrease spasm and allow for unrestricted elimination of urine/feces for improved overall QOL.    Baseline IE: not demonstrate    Time 8    Period Weeks    Status New    Target Date 08/30/20      PT LONG TERM GOAL #4   Title Patient will report less than 25% incidence of stress urinary incontinence over the course of 3 weeks while coughing/sneezing/laughing/prolonged activity in order to demonstrate improved PFM coordination, strength, and function for improved overall QOL.    Baseline IE: 100%    Time 8    Period Weeks    Status New    Target Date 08/30/20      PT LONG TERM GOAL #5   Title Patient will demonstrate improved function as evidenced by a score of 67 on FOTO measure for full participation in activities at  home and in the community.    Baseline IE: 49    Time 8    Period Weeks    Status New    Target Date 08/30/20                   Plan - 08/16/20 0855     Clinical Impression Statement Patient presents to clinic with excellent motivation to participate in therapy. Patient demonstrates deficits in posture, PFM coordination, PFM strength, and IAP management. Patient able to achieve diaphragmatic breath with PFM length with moderate cueing during today's session and responded positively to educational interventions. Patient will benefit from continued skilled therapeutic intervention to address remaining deficits in posture, PFM coordination, PFM strength, and IAP management in order to increase function  and improve overall QOL.    Personal Factors and Comorbidities Age;Comorbidity 2;Sex;Behavior Pattern;Time since onset of injury/illness/exacerbation    Comorbidities migraine, osteoporosis,    Examination-Activity Limitations Continence    Examination-Participation Restrictions Community Activity;Yard Work    [employer] officer Evolving/Moderate complexity    Rehab Potential Good    PT Frequency 1x / week    PT Duration 8 weeks    PT Treatment/Interventions ADLs/Self Care Home Management;Biofeedback;Cryotherapy;Electrical Stimulation;Iontophoresis 4mg /ml Dexamethasone;Moist Heat;Therapeutic exercise;Neuromuscular re-education;Patient/family education;Orthotic Fit/Training;Manual techniques;Scar mobilization;Dry needling;Taping;Spinal Manipulations;Joint Manipulations    PT Next Visit Plan lumbar mobility; PFM progression    PT Home Exercise Plan [id number]    Consulted and Agree with Plan of Care Patient             Patient will benefit from skilled therapeutic intervention in order to improve the following deficits and impairments:  Decreased endurance, Improper body mechanics, Postural dysfunction, Decreased strength, Decreased activity tolerance, Decreased  coordination  Visit Diagnosis: Other lack of coordination  Muscle weakness (generalized)     Problem List 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 [place], [place], [address] Phone: [phone]   Fax:  [phone]  Name: Caroline Harmon MRN: [id number] Date of Birth: [birth year]

## 2020-08-23 ENCOUNTER — Encounter: Payer: BC Managed Care – PPO | Admitting: Physical Therapy

## 2020-09-06 ENCOUNTER — Other Ambulatory Visit: Payer: Self-pay

## 2020-09-06 ENCOUNTER — Encounter: Payer: Self-pay | Admitting: Physical Therapy

## 2020-09-06 ENCOUNTER — Ambulatory Visit: Payer: BC Managed Care – PPO | Attending: Obstetrics and Gynecology | Admitting: Physical Therapy

## 2020-09-06 DIAGNOSIS — R278 Other lack of coordination: Secondary | ICD-10-CM | POA: Diagnosis not present

## 2020-09-06 DIAGNOSIS — M6281 Muscle weakness (generalized): Secondary | ICD-10-CM | POA: Diagnosis present

## 2020-09-06 NOTE — Therapy (Signed)
[facility] [address] [place], [place], [address] Phone: [phone]   Fax:  [phone]  Physical Therapy Treatment  Patient Details  Name: Caroline Harmon MRN: [id number] Date of Birth: 1961-01-11 Referring Provider (PT): [name]   Encounter Date: 09/06/2020   PT End of Session - 09/06/20 1122     Visit Number 3    Number of Visits 8    Date for PT Re-Evaluation 11/29/20    PT Start Time 1110    PT Stop Time 1205    PT Time Calculation (min) 55 min    Activity Tolerance Patient tolerated treatment well    Behavior During Therapy [facility] for tasks assessed/performed             Past Medical History:  Diagnosis Date   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   Subjective Assessment - 09/06/20 1109     Subjective Patient notes she has not had any leakage; she notes that she is ready to add in more physical activity. Patient has not leaked at all with walking at a fast pace or with hiking. Patient notes that she also hasn't noticed any increased urgency when on treadmill. Patient confused and frustrated with not having leakage without any exercise effort directly at PFM.    Currently in Pain? No/denies              TREATMENT  Neuromuscular Re-education: Supine hooklying diaphragmatic breathing with VCs and TCs for downregulation of the nervous system and improved management of IAP Supine hooklying, PFM lengthening with inhalation. VCs and TCs to decrease compensatory patterns and encourage optimal relaxation of the PFM. Supine hooklying, PFM contractions with exhalation. VCs and TCs to decrease compensatory patterns and encourage activation of the PFM. Standing Pilates Postural Control Hug a  Tree, RTB   Serve a Tray, RTB Reassessed goals; see below.  Patient educated throughout session on appropriate technique and form using multi-modal cueing, HEP, and activity modification. Patient articulated understanding and returned demonstration.  Patient Response to interventions: Return in ~4 week for progression.  ASSESSMENT Patient presents to clinic with excellent motivation to participate in therapy. Patient demonstrates deficits in posture, PFM coordination, PFM strength, and IAP management. Patient able to coordinate a 2-3/5 MMT PFM for 3 consecutive fast twitch reps during today's session and responded positively to postural  interventions. Patient has only had the opportunity to participate in 3 sessions since outset due to scheduling conflicts; however she has made considerable progress towards goals. Patient's condition has the potential to improve in response to therapy. Maximum improvement is yet to be obtained. The anticipated improvement is attainable and reasonable in a generally predictable time. Patient will benefit from continued skilled therapeutic intervention to address remaining deficits in posture, PFM coordination, PFM strength, and IAP management in order to increase function and improve overall QOL.    PT Long Term Goals - 09/06/20 1123       PT LONG TERM GOAL #1   Title Patient will demonstrate independence with HEP in order to maximize therapeutic gains and improve carryover from physical therapy sessions to ADLs in the home and community.    Baseline IE: not initiated;  7/11: IND    Time 8    Period Weeks    Status Achieved    Target Date 08/30/20      PT LONG TERM GOAL #2   Title Patient will demonstrate circumferential and sequential contraction of >4/5 MMT, > 6 sec hold x10 and 5 consecutive quick flicks with </= 10 min rest between testing bouts, and relaxation of the PFM coordinated with breath for improved management of intra-abdominal pressure and  normal bowel and bladder function without the presence of pain nor incontinence in order to improve participation at home and in the community.    Baseline IE: not demonstrated; 7/11: 3/5 MMT, x4 sec hold x1, x3 consecutive quick flicks    Time 12    Period Weeks    Status On-going    Target Date 11/29/20      PT LONG TERM GOAL #3   Title Patient will demonstrate coordinated lengthening and relaxation of PFM with diaphragmatic inhalation in order to decrease spasm and allow for unrestricted elimination of urine/feces for improved overall QOL.    Baseline IE: not demonstrate; 7/11: IND    Time 8    Period Weeks    Status Achieved      PT LONG TERM GOAL #4   Title Patient will report less than 25% incidence of stress urinary incontinence over the course of 3 weeks while coughing/sneezing/laughing/prolonged activity in order to demonstrate improved PFM coordination, strength, and function for improved overall QOL.    Baseline IE: 100%; 7/11: 0% (continues to use hip adduction strategy/sitting)    Time 12    Period Weeks    Status Partially Met    Target Date 11/29/20      PT LONG TERM GOAL #5   Title Patient will demonstrate improved function as evidenced by a score of 67 on FOTO measure for full participation in activities at home and in the community.    Baseline IE: 58; 7/11: 71    Time 12    Period Weeks    Status On-going    Target Date 11/29/20                   Plan - 09/06/20 1305     Clinical Impression Statement Patient presents to clinic with excellent motivation to participate in therapy. Patient demonstrates deficits in posture, PFM coordination, PFM strength, and IAP management. Patient able to coordinate a 2-3/5 MMT PFM for 3 consecutive fast twitch reps during today's session and responded positively to postural  interventions. Patient has only had the opportunity to participate in 3 sessions since outset due to scheduling conflicts; however she has made  considerable progress towards goals. Patient's condition has the potential to improve in response to therapy. Maximum improvement is yet to be obtained. The anticipated improvement is attainable and reasonable in a generally predictable time. Patient will benefit from continued skilled therapeutic intervention to address remaining deficits in posture, PFM coordination, PFM strength, and IAP management in order to increase function and improve overall QOL.    Personal Factors and Comorbidities Age;Comorbidity 2;Sex;Behavior Pattern;Time since onset of injury/illness/exacerbation    Comorbidities migraine, osteoporosis,    Examination-Activity Limitations Continence    Examination-Participation Restrictions Community Activity;Yard Work    [employer] officer Evolving/Moderate complexity    Rehab Potential Good    PT Frequency 1x / week    PT Duration 12 weeks    PT Treatment/Interventions ADLs/Self Care Home Management;Biofeedback;Cryotherapy;Electrical Stimulation;Iontophoresis 65m/ml Dexamethasone;Moist Heat;Therapeutic exercise;Neuromuscular re-education;Patient/family education;Orthotic Fit/Training;Manual  techniques;Scar mobilization;Dry needling;Taping;Spinal Manipulations;Joint Manipulations    PT Next Visit Plan lumbar mobility; PFM progression    PT Home Exercise Plan [id number]    Consulted and Agree with Plan of Care Patient             Patient will benefit from skilled therapeutic intervention in order to improve the following deficits and impairments:  Decreased endurance, Improper body mechanics, Postural dysfunction, Decreased strength, Decreased activity tolerance, Decreased coordination  Visit Diagnosis: Other lack of coordination  Muscle weakness (generalized)     Problem List 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 [place], [place], [address] Phone: [phone]   Fax:  [phone]  Name: Caroline Harmon MRN: [id number] Date of Birth: Jun 30, 1960

## 2020-09-13 ENCOUNTER — Encounter: Payer: BC Managed Care – PPO | Admitting: Physical Therapy

## 2020-10-04 ENCOUNTER — Encounter: Payer: BC Managed Care – PPO | Admitting: Physical Therapy

## 2020-10-11 ENCOUNTER — Ambulatory Visit: Payer: BC Managed Care – PPO | Admitting: Physical Therapy

## 2020-12-20 ENCOUNTER — Other Ambulatory Visit: Payer: Self-pay | Admitting: Family Medicine

## 2020-12-20 DIAGNOSIS — G43709 Chronic migraine without aura, not intractable, without status migrainosus: Secondary | ICD-10-CM

## 2020-12-21 NOTE — Telephone Encounter (Signed)
PCP Emily Filbert with [facility]

## 2021-01-26 ENCOUNTER — Encounter: Payer: BC Managed Care – PPO | Admitting: Family Medicine

## 2021-06-24 ENCOUNTER — Other Ambulatory Visit: Payer: Self-pay | Admitting: Obstetrics and Gynecology

## 2021-06-24 ENCOUNTER — Other Ambulatory Visit: Payer: Self-pay | Admitting: Family Medicine

## 2021-06-24 DIAGNOSIS — Z1231 Encounter for screening mammogram for malignant neoplasm of breast: Secondary | ICD-10-CM

## 2021-07-13 NOTE — Progress Notes (Signed)
ANNUAL PREVENTATIVE CARE GYNECOLOGY  ENCOUNTER NOTE  Subjective:       Caroline Harmon is a 61 y.o. G54P2002 female here for a routine annual gynecologic exam. The patient is sexually active. The patient has never been on hormone replacement therapy. Patient denies post-menopausal vaginal bleeding. The patient wears seatbelts: yes. The patient participates in regular exercise: yes. Has the patient ever been transfused or tattooed?: no. The patient reports that there is not domestic violence in her life. She has no complaints today.  She does have complaints of vaginal dryness during and after intercourse. Is using [place] which helps some.   Gynecologic History No LMP recorded. Patient is postmenopausal. Contraception: post menopausal status Last Pap: 06/05/2019. Results were: normal Last mammogram: 06/24/2020. Results were: normal Last Colonoscopy: 2015.  Was normal. Repeat in 10 years.  Last Dexa Scan: 12/24/2019.  Osteoporosis.  T score is - 2.9.   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   Migraine    Osteoporosis     Family History  Problem Relation Age of Onset   Migraines Mother    Heart attack Father    Cancer Father        skin   Hypertension Father    Breast cancer Neg Hx     Past Surgical History:  Procedure Laterality Date   CAROTID BODY TUMOR EXCISION     benign   COLONOSCOPY  10/06/13    Social History   Socioeconomic History   Marital status: Married    Spouse name: Tom   Number of children: 1   Years of education: 12   Highest education level: Bachelor's degree (e.g., BA, AB, BS)  Occupational History   Not on  file  Tobacco Use   Smoking status: Never   Smokeless tobacco: Never  Vaping Use   Vaping Use: Never used  Substance and Sexual Activity   Alcohol use: Yes    Comment: occassional   Drug use: No   Sexual activity: Yes    Birth control/protection: None, Post-menopausal  Other Topics Concern   Not on file  Social History Narrative   Not on file   Social Determinants of Health   Financial Resource Strain: Not on file  Food Insecurity: Not on file  Transportation Needs: Not on file  Physical Activity: Not on file  Stress: Not on file  Social Connections: Not on file  Intimate Partner Violence: Not on file    Current Outpatient Medications on File Prior to Visit  Medication Sig Dispense Refill   aspirin EC 81 MG tablet Take 1 tablet (81 mg total) by mouth daily.     Calcium-Vitamin D-Vitamin K (CALCIUM SOFT CHEWS) [phone] MG-UNT-MCG CHEW      eletriptan (RELPAX) 40 MG tablet May repeat in 2 hours if headache persists or recurs. 10 tablet 11   Multiple Vitamins-Minerals (IMMUNE SUPPORT VITAMIN C PO) Take by mouth.     No current facility-administered medications on  file prior to visit.    Allergies  Allergen Reactions   Topamax [Topiramate] Other (See Comments)    Hematuria      Review of Systems ROS Review of Systems - General ROS: negative for - chills, fatigue, fever, hot flashes, night sweats, weight gain or weight loss Psychological ROS: negative for - anxiety, decreased libido, depression, mood swings, physical abuse or sexual abuse Ophthalmic ROS: negative for - blurry vision, eye pain or loss of vision ENT ROS: negative for - headaches, hearing change, visual changes or vocal changes Allergy and Immunology ROS: negative for - hives, itchy/watery eyes or seasonal allergies Hematological and Lymphatic ROS: negative for - bleeding problems, bruising, swollen lymph nodes or weight loss Endocrine ROS: negative for - galactorrhea, hair pattern changes, hot  flashes, malaise/lethargy, mood swings, palpitations, polydipsia/polyuria, skin changes, temperature intolerance or unexpected weight changes Breast ROS: negative for - new or changing breast lumps or nipple discharge Respiratory ROS: negative for - cough or shortness of breath Cardiovascular ROS: negative for - chest pain, irregular heartbeat, palpitations or shortness of breath Gastrointestinal ROS: no abdominal pain, change in bowel habits, or black or bloody stools Genito-Urinary ROS: no dysuria, trouble voiding, or hematuria Musculoskeletal ROS: negative for - joint pain or joint stiffness Neurological ROS: negative for - bowel and bladder control changes Dermatological ROS: negative for rash and skin lesion changes   Objective:   BP (!) 118/56   Pulse 73   Resp 16   Ht 5' 6.5" (1.689 m)   Wt 124 lb 9.6 oz (56.5 kg)   BMI 19.81 kg/m  CONSTITUTIONAL: Well-developed, well-nourished female in no acute distress.  PSYCHIATRIC: Normal mood and affect. Normal behavior. Normal judgment and thought content. [place]: Alert and oriented to person, place, and time. Normal muscle tone coordination. No cranial nerve deficit noted. HENT:  Normocephalic, atraumatic, External right and left ear normal. Oropharynx is clear and moist EYES: Conjunctivae and EOM are normal. Pupils are equal, round, and reactive to light. No scleral icterus.  NECK: Normal range of motion, supple, no masses.  Normal thyroid.  SKIN: Skin is warm and dry. No rash noted. Not diaphoretic. No erythema. No pallor. CARDIOVASCULAR: Normal heart rate noted, regular rhythm, no murmur. RESPIRATORY: Clear to auscultation bilaterally. Effort and breath sounds normal, no problems with respiration noted. BREASTS: Symmetric in size. No masses, skin changes, nipple drainage, or lymphadenopathy. ABDOMEN: Soft, normal bowel sounds, no distention noted.  No tenderness, rebound or guarding.  BLADDER: Normal PELVIC:  Bladder no bladder  distension noted  Urethra: normal appearing urethra with no masses, tenderness or lesions  Vulva: normal appearing vulva with no masses, tenderness or lesions  Vagina: atrophic (mild to moderate), no lesions or discharge.   Cervix: normal appearing cervix without discharge or lesions  Uterus: uterus is normal size, shape, consistency and nontender  Adnexa: normal adnexa in size, nontender and no masses  RV: External Exam NormaI, No Rectal Masses, and Normal Sphincter tone  MUSCULOSKELETAL: Normal range of motion. No tenderness.  No cyanosis, clubbing, or edema.  2+ distal pulses. LYMPHATIC: No Axillary, Supraclavicular, or Inguinal Adenopathy.   Labs: Lab Results  Component Value Date   WBC 5.7 01/26/2020   HGB 13.4 01/26/2020   HCT 40.4 01/26/2020   MCV 92.4 01/26/2020   PLT 312 01/26/2020    Lab Results  Component Value Date   CREATININE 0.89 01/26/2020   BUN 25 01/26/2020   NA 140 01/26/2020   K 4.3 01/26/2020   CL 102  01/26/2020   CO2 30 01/26/2020    Lab Results  Component Value Date   ALT 27 01/26/2020   AST 34 01/26/2020   ALKPHOS 69 12/07/2015   BILITOT 0.6 01/26/2020    Lab Results  Component Value Date   CHOL 195 01/26/2020   HDL 86 01/26/2020   LDLCALC 90 01/26/2020   TRIG 101 01/26/2020   CHOLHDL 2.3 01/26/2020    Lab Results  Component Value Date   TSH 1.76 12/13/2017    No results found for: HGBA1C   Assessment:   1. Encounter for well woman exam with routine gynecological exam   2. Menopause   3. Vaginal atrophy   4. Osteoporosis without current pathological fracture, unspecified osteoporosis type     Plan:  - Pap:  UTD - Mammogram: Ordered - Colon Screening:   UTD - Labs:  None ordered. Has labs by PCP.  - Routine preventative health maintenance measures emphasized: Exercise/Diet/Weight control, Tobacco Warnings, Alcohol/Substance use risks, Stress Management.  - Osteoporosis: Continue Calcium and Vitamin D supplements, trying to  do weight bearing exercises. Is hesitant about taking other medications at this time. Will be due for repeat bone density scan later this year.  - COVID Vaccination status: up to date.  - Vaginal atrophy, using [place] Jelly. Discussed other lubricants, given samples of [employer] Lube. Advised that if symptoms worsen, could consider medication such as Duavee, as it can help both with menopausal symptoms and bone loss.  - Return to [place], MD [facility]

## 2021-07-13 NOTE — Patient Instructions (Addendum)
Preventive Care 63-64 Years Old, Female Preventive care refers to lifestyle choices and visits with your health care provider that can promote health and wellness. Preventive care visits are also called wellness exams. What can I expect for my preventive care visit? Counseling Your health care provider may ask you questions about your: Medical history, including: Past medical problems. Family medical history. Pregnancy history. Current health, including: Menstrual cycle. Method of birth control. Emotional well-being. Home life and relationship well-being. Sexual activity and sexual health. Lifestyle, including: Alcohol, nicotine or tobacco, and drug use. Access to firearms. Diet, exercise, and sleep habits. Work and work environment. Sunscreen use. Safety issues such as seatbelt and bike helmet use. Physical exam Your health care provider will check your: Height and weight. These may be used to calculate your BMI (body mass index). BMI is a measurement that tells if you are at a healthy weight. Waist circumference. This measures the distance around your waistline. This measurement also tells if you are at a healthy weight and may help predict your risk of certain diseases, such as type 2 diabetes and high blood pressure. Heart rate and blood pressure. Body temperature. Skin for abnormal spots. What immunizations do I need?  Vaccines are usually given at various ages, according to a schedule. Your health care provider will recommend vaccines for you based on your age, medical history, and lifestyle or other factors, such as travel or where you work. What tests do I need? Screening Your health care provider may recommend screening tests for certain conditions. This may include: Lipid and cholesterol levels. Diabetes screening. This is done by checking your blood sugar (glucose) after you have not eaten for a while (fasting). Pelvic exam and Pap test. Hepatitis B test. Hepatitis C  test. HIV (human immunodeficiency virus) test. STI (sexually transmitted infection) testing, if you are at risk. Lung cancer screening. Colorectal cancer screening. Mammogram. Talk with your health care provider about when you should start having regular mammograms. This may depend on whether you have a family history of breast cancer. BRCA-related cancer screening. This may be done if you have a family history of breast, ovarian, tubal, or peritoneal cancers. Bone density scan. This is done to screen for osteoporosis. Talk with your health care provider about your test results, treatment options, and if necessary, the need for more tests. Follow these instructions at home: Eating and drinking  Eat a diet that includes fresh fruits and vegetables, whole grains, lean protein, and low-fat dairy products. Take vitamin and mineral supplements as recommended by your health care provider. Do not drink alcohol if: Your health care provider tells you not to drink. You are pregnant, may be pregnant, or are planning to become pregnant. If you drink alcohol: Limit how much you have to 0-1 drink a day. Know how much alcohol is in your drink. In the [place] one drink equals one 12 oz bottle of beer (355 mL), one 5 oz glass of wine (148 mL), or one 1 oz glass of hard liquor (44 mL). Lifestyle Brush your teeth every morning and night with fluoride toothpaste. Floss one time each day. Exercise for at least 30 minutes 5 or more days each week. Do not use any products that contain nicotine or tobacco. These products include cigarettes, chewing tobacco, and vaping devices, such as e-cigarettes. If you need help quitting, ask your health care provider. Do not use drugs. If you are sexually active, practice safe sex. Use a condom or other form of protection to   prevent STIs. If you do not wish to become pregnant, use a form of birth control. If you plan to become pregnant, see your health care provider for a  prepregnancy visit. Take aspirin only as told by your health care provider. Make sure that you understand how much to take and what form to take. Work with your health care provider to find out whether it is safe and beneficial for you to take aspirin daily. Find healthy ways to manage stress, such as: Meditation, yoga, or listening to music. Journaling. Talking to a trusted person. Spending time with friends and family. Minimize exposure to UV radiation to reduce your risk of skin cancer. Safety Always wear your seat belt while driving or riding in a vehicle. Do not drive: If you have been drinking alcohol. Do not ride with someone who has been drinking. When you are tired or distracted. While texting. If you have been using any mind-altering substances or drugs. Wear a helmet and other protective equipment during sports activities. If you have firearms in your house, make sure you follow all gun safety procedures. Seek help if you have been physically or sexually abused. What's next? Visit your health care provider once a year for an annual wellness visit. Ask your health care provider how often you should have your eyes and teeth checked. Stay up to date on all vaccines. This information is not intended to replace advice given to you by your health care provider. Make sure you discuss any questions you have with your health care provider. Document Revised: 08/11/2020 Document Reviewed: 08/11/2020 Elsevier Patient Education  [place].

## 2021-07-14 ENCOUNTER — Ambulatory Visit (INDEPENDENT_AMBULATORY_CARE_PROVIDER_SITE_OTHER): Payer: BC Managed Care – PPO | Admitting: Obstetrics and Gynecology

## 2021-07-14 ENCOUNTER — Encounter: Payer: Self-pay | Admitting: Obstetrics and Gynecology

## 2021-07-14 VITALS — BP 118/56 | HR 73 | Resp 16 | Ht 66.5 in | Wt 124.6 lb

## 2021-07-14 DIAGNOSIS — N952 Postmenopausal atrophic vaginitis: Secondary | ICD-10-CM

## 2021-07-14 DIAGNOSIS — Z01419 Encounter for gynecological examination (general) (routine) without abnormal findings: Secondary | ICD-10-CM | POA: Diagnosis not present

## 2021-07-14 DIAGNOSIS — M810 Age-related osteoporosis without current pathological fracture: Secondary | ICD-10-CM | POA: Diagnosis not present

## 2021-07-14 DIAGNOSIS — Z780 Asymptomatic menopausal state: Secondary | ICD-10-CM | POA: Diagnosis not present

## 2021-07-27 ENCOUNTER — Ambulatory Visit
Admission: RE | Admit: 2021-07-27 | Discharge: 2021-07-27 | Disposition: A | Discharge status: Home / Self Care | Payer: BC Managed Care – PPO | Source: Ambulatory Visit | Attending: Obstetrics and Gynecology | Admitting: Obstetrics and Gynecology

## 2021-07-27 DIAGNOSIS — Z1231 Encounter for screening mammogram for malignant neoplasm of breast: Secondary | ICD-10-CM | POA: Insufficient documentation

## 2022-07-19 ENCOUNTER — Encounter: Payer: Self-pay | Admitting: Obstetrics and Gynecology

## 2022-07-31 ENCOUNTER — Ambulatory Visit
Admission: RE | Admit: 2022-07-31 | Discharge: 2022-07-31 | Disposition: A | Discharge status: Home / Self Care | Payer: BC Managed Care – PPO | Source: Ambulatory Visit | Attending: Internal Medicine | Admitting: Internal Medicine

## 2022-07-31 ENCOUNTER — Other Ambulatory Visit: Payer: Self-pay | Admitting: Internal Medicine

## 2022-07-31 DIAGNOSIS — Z1231 Encounter for screening mammogram for malignant neoplasm of breast: Secondary | ICD-10-CM

## 2022-08-08 NOTE — Progress Notes (Unsigned)
ANNUAL PREVENTATIVE CARE GYNECOLOGY  ENCOUNTER NOTE  Subjective:       Caroline Harmon is a 62 y.o. G19P2002 female here for a routine annual gynecologic exam. The patient is sexually active. The patient has never been taking hormone replacement therapy. Patient denies post-menopausal vaginal bleeding. The patient wears seatbelts: {yes/no:311178}. The patient participates in regular exercise: {yes/no/not asked:9010}. Has the patient ever been transfused or tattooed?: {yes/no/not asked:9010}. The patient reports that there {is/is not:9024} domestic violence in her life.  Current complaints: 1.  ***    Gynecologic History No LMP recorded. Patient is postmenopausal. Contraception: post menopausal status Last Pap: 06/05/2019. Results were: normal Last mammogram: 07/31/2022. Results were: normal Last Colonoscopy: 10/06/2013: 10 years Last Dexa Scan: 12/24/2019.  Osteoporosis.  T score is - 2.9.    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   Migraine    Osteoporosis     Family History  Problem Relation Age of Onset   Migraines Mother    Heart attack Father    Cancer Father        skin   Hypertension Father    Breast cancer Neg Hx     Past Surgical History:  Procedure Laterality Date   CAROTID BODY TUMOR EXCISION     benign   COLONOSCOPY  10/06/13    Social History   Socioeconomic History   Marital status: Married    Spouse name: Tom   Number of children: 1   Years of education: 12   Highest education level: Bachelor's degree (e.g., BA, AB, BS)  Occupational History   Not on file  Tobacco Use   Smoking status: Never   Smokeless  tobacco: Never  Vaping Use   Vaping Use: Never used  Substance and Sexual Activity   Alcohol use: Yes    Comment: occassional   Drug use: No   Sexual activity: Yes    Birth control/protection: None, Post-menopausal  Other Topics Concern   Not on file  Social History Narrative   Not on file   Social Determinants of Health   Financial Resource Strain: Low Risk  (01/26/2020)   Overall Financial Resource Strain (CARDIA)    Difficulty of Paying Living Expenses: Not hard at all  Food Insecurity: No Food Insecurity (01/26/2020)   Hunger Vital Sign    Worried About Running Out of Food in the Last Year: Never true    Ran Out of Food in the Last Year: Never true  Transportation Needs: No Transportation Needs (01/26/2020)   PRAPARE - Administrator, Civil Service (Medical): No    Lack of Transportation (Non-Medical): No  Physical Activity: Sufficiently Active (01/26/2020)   Exercise Vital Sign    Days of Exercise per Week: 5 days    Minutes of Exercise per Session: 60 min  Stress: No Stress Concern Present (01/26/2020)   [employer] of Occupational Health - Occupational Stress Questionnaire    Feeling of Stress : Not  at all  Social Connections: Socially Integrated (01/26/2020)   Social Connection and Isolation Panel [NHANES]    Frequency of Communication with Friends and Family: More than three times a week    Frequency of Social Gatherings with Friends and Family: More than three times a week    Attends Religious Services: 1 to 4 times per year    Active Member of [employer] or Organizations: Yes    Attends Banker Meetings: 1 to 4 times per year    Marital Status: Married  Catering manager Violence: Not At Risk (01/26/2020)   Humiliation, Afraid, Rape, and Kick questionnaire    Fear of Current or Ex-Partner: No    Emotionally Abused: No    Physically Abused: No    Sexually Abused: No    Current Outpatient Medications on File Prior to Visit   Medication Sig Dispense Refill   aspirin EC 81 MG tablet Take 1 tablet (81 mg total) by mouth daily.     Calcium-Vitamin D-Vitamin K (CALCIUM SOFT CHEWS) [phone] MG-UNT-MCG CHEW      eletriptan (RELPAX) 40 MG tablet May repeat in 2 hours if headache persists or recurs. 10 tablet 11   No current facility-administered medications on file prior to visit.    Allergies  Allergen Reactions   Topamax [Topiramate] Other (See Comments)    Hematuria      Review of Systems ROS Review of Systems - General ROS: negative for - chills, fatigue, fever, hot flashes, night sweats, weight gain or weight loss Psychological ROS: negative for - anxiety, decreased libido, depression, mood swings, physical abuse or sexual abuse Ophthalmic ROS: negative for - blurry vision, eye pain or loss of vision ENT ROS: negative for - headaches, hearing change, visual changes or vocal changes Allergy and Immunology ROS: negative for - hives, itchy/watery eyes or seasonal allergies Hematological and Lymphatic ROS: negative for - bleeding problems, bruising, swollen lymph nodes or weight loss Endocrine ROS: negative for - galactorrhea, hair pattern changes, hot flashes, malaise/lethargy, mood swings, palpitations, polydipsia/polyuria, skin changes, temperature intolerance or unexpected weight changes Breast ROS: negative for - new or changing breast lumps or nipple discharge Respiratory ROS: negative for - cough or shortness of breath Cardiovascular ROS: negative for - chest pain, irregular heartbeat, palpitations or shortness of breath Gastrointestinal ROS: no abdominal pain, change in bowel habits, or black or bloody stools Genito-Urinary ROS: no dysuria, trouble voiding, or hematuria Musculoskeletal ROS: negative for - joint pain or joint stiffness Neurological ROS: negative for - bowel and bladder control changes Dermatological ROS: negative for rash and skin lesion changes   Objective:   There were no  vitals taken for this visit. CONSTITUTIONAL: Well-developed, well-nourished female in no acute distress.  PSYCHIATRIC: Normal mood and affect. Normal behavior. Normal judgment and thought content. NEUROLGIC: Alert and oriented to person, place, and time. Normal muscle tone coordination. No cranial nerve deficit noted. HENT:  Normocephalic, atraumatic, External right and left ear normal. Oropharynx is clear and moist EYES: Conjunctivae and EOM are normal. Pupils are equal, round, and reactive to light. No scleral icterus.  NECK: Normal range of motion, supple, no masses.  Normal thyroid.  SKIN: Skin is warm and dry. No rash noted. Not diaphoretic. No erythema. No pallor. CARDIOVASCULAR: Normal heart rate noted, regular rhythm, no murmur. RESPIRATORY: Clear to auscultation bilaterally. Effort and breath sounds normal, no problems with respiration noted. BREASTS: Symmetric in size. No masses, skin changes, nipple drainage, or lymphadenopathy. ABDOMEN: Soft, normal bowel sounds, no  distention noted.  No tenderness, rebound or guarding.  BLADDER: Normal PELVIC:  Bladder {:311640}  Urethra: {:311719}  Vulva: {:311722}  Vagina: {:311643}  Cervix: {:311644}  Uterus: {:311718}  Adnexa: {:311645}  RV: {Blank multiple:19196::"External Exam NormaI","No Rectal Masses","Normal Sphincter tone"}  MUSCULOSKELETAL: Normal range of motion. No tenderness.  No cyanosis, clubbing, or edema.  2+ distal pulses. LYMPHATIC: No Axillary, Supraclavicular, or Inguinal Adenopathy.   Labs: Lab Results  Component Value Date   WBC 5.7 01/26/2020   HGB 13.4 01/26/2020   HCT 40.4 01/26/2020   MCV 92.4 01/26/2020   PLT 312 01/26/2020    Lab Results  Component Value Date   CREATININE 0.89 01/26/2020   BUN 25 01/26/2020   NA 140 01/26/2020   K 4.3 01/26/2020   CL 102 01/26/2020   CO2 30 01/26/2020    Lab Results  Component Value Date   ALT 27 01/26/2020   AST 34 01/26/2020   ALKPHOS 69 12/07/2015    BILITOT 0.6 01/26/2020    Lab Results  Component Value Date   CHOL 195 01/26/2020   HDL 86 01/26/2020   LDLCALC 90 01/26/2020   TRIG 101 01/26/2020   CHOLHDL 2.3 01/26/2020    Lab Results  Component Value Date   TSH 1.76 12/13/2017    No results found for: "HGBA1C"   Assessment:   No diagnosis found.   Plan:  Pap: Pap, Reflex if ASCUS Mammogram:  UTD Colon Screening:   UTD Labs: {Blank multiple:19196::"Lipid 1","FBS","TSH","Hemoglobin A1C","Vit D Level""***"} Routine preventative health maintenance measures emphasized:  Self Breast Exams and Exercise/Diet/Weight control COVID Vaccination status: Return to Clinic - 1 Year   Hildred Laser, MD [facility]

## 2022-08-09 ENCOUNTER — Other Ambulatory Visit (HOSPITAL_COMMUNITY)
Admission: RE | Admit: 2022-08-09 | Discharge: 2022-08-09 | Disposition: A | Discharge status: Home / Self Care | Payer: BC Managed Care – PPO | Source: Ambulatory Visit | Attending: Obstetrics and Gynecology | Admitting: Obstetrics and Gynecology

## 2022-08-09 ENCOUNTER — Ambulatory Visit (INDEPENDENT_AMBULATORY_CARE_PROVIDER_SITE_OTHER): Payer: BC Managed Care – PPO | Admitting: Obstetrics and Gynecology

## 2022-08-09 ENCOUNTER — Encounter: Payer: Self-pay | Admitting: Obstetrics and Gynecology

## 2022-08-09 VITALS — BP 133/68 | HR 86 | Resp 16 | Ht 66.5 in | Wt 123.8 lb

## 2022-08-09 DIAGNOSIS — N952 Postmenopausal atrophic vaginitis: Secondary | ICD-10-CM

## 2022-08-09 DIAGNOSIS — Z01419 Encounter for gynecological examination (general) (routine) without abnormal findings: Secondary | ICD-10-CM | POA: Diagnosis not present

## 2022-08-09 DIAGNOSIS — Z124 Encounter for screening for malignant neoplasm of cervix: Secondary | ICD-10-CM | POA: Diagnosis present

## 2022-08-09 DIAGNOSIS — M810 Age-related osteoporosis without current pathological fracture: Secondary | ICD-10-CM

## 2022-08-09 DIAGNOSIS — Z131 Encounter for screening for diabetes mellitus: Secondary | ICD-10-CM

## 2022-08-09 DIAGNOSIS — Z13220 Encounter for screening for lipoid disorders: Secondary | ICD-10-CM

## 2022-08-09 NOTE — Patient Instructions (Signed)
Preventive Care 40-64 Years Old, Female Preventive care refers to lifestyle choices and visits with your health care provider that can promote health and wellness. Preventive care visits are also called wellness exams. What can I expect for my preventive care visit? Counseling Your health care provider may ask you questions about your: Medical history, including: Past medical problems. Family medical history. Pregnancy history. Current health, including: Menstrual cycle. Method of birth control. Emotional well-being. Home life and relationship well-being. Sexual activity and sexual health. Lifestyle, including: Alcohol, nicotine or tobacco, and drug use. Access to firearms. Diet, exercise, and sleep habits. Work and work environment. Sunscreen use. Safety issues such as seatbelt and bike helmet use. Physical exam Your health care provider will check your: Height and weight. These may be used to calculate your BMI (body mass index). BMI is a measurement that tells if you are at a healthy weight. Waist circumference. This measures the distance around your waistline. This measurement also tells if you are at a healthy weight and may help predict your risk of certain diseases, such as type 2 diabetes and high blood pressure. Heart rate and blood pressure. Body temperature. Skin for abnormal spots. What immunizations do I need?  Vaccines are usually given at various ages, according to a schedule. Your health care provider will recommend vaccines for you based on your age, medical history, and lifestyle or other factors, such as travel or where you work. What tests do I need? Screening Your health care provider may recommend screening tests for certain conditions. This may include: Lipid and cholesterol levels. Diabetes screening. This is done by checking your blood sugar (glucose) after you have not eaten for a while (fasting). Pelvic exam and Pap test. Hepatitis B test. Hepatitis C  test. HIV (human immunodeficiency virus) test. STI (sexually transmitted infection) testing, if you are at risk. Lung cancer screening. Colorectal cancer screening. Mammogram. Talk with your health care provider about when you should start having regular mammograms. This may depend on whether you have a family history of breast cancer. BRCA-related cancer screening. This may be done if you have a family history of breast, ovarian, tubal, or peritoneal cancers. Bone density scan. This is done to screen for osteoporosis. Talk with your health care provider about your test results, treatment options, and if necessary, the need for more tests. Follow these instructions at home: Eating and drinking  Eat a diet that includes fresh fruits and vegetables, whole grains, lean protein, and low-fat dairy products. Take vitamin and mineral supplements as recommended by your health care provider. Do not drink alcohol if: Your health care provider tells you not to drink. You are pregnant, may be pregnant, or are planning to become pregnant. If you drink alcohol: Limit how much you have to 0-1 drink a day. Know how much alcohol is in your drink. In the [place] one drink equals one 12 oz bottle of beer (355 mL), one 5 oz glass of wine (148 mL), or one 1 oz glass of hard liquor (44 mL). Lifestyle Brush your teeth every morning and night with fluoride toothpaste. Floss one time each day. Exercise for at least 30 minutes 5 or more days each week. Do not use any products that contain nicotine or tobacco. These products include cigarettes, chewing tobacco, and vaping devices, such as e-cigarettes. If you need help quitting, ask your health care provider. Do not use drugs. If you are sexually active, practice safe sex. Use a condom or other form of protection to   prevent STIs. If you do not wish to become pregnant, use a form of birth control. If you plan to become pregnant, see your health care provider for a  prepregnancy visit. Take aspirin only as told by your health care provider. Make sure that you understand how much to take and what form to take. Work with your health care provider to find out whether it is safe and beneficial for you to take aspirin daily. Find healthy ways to manage stress, such as: Meditation, yoga, or listening to music. Journaling. Talking to a trusted person. Spending time with friends and family. Minimize exposure to UV radiation to reduce your risk of skin cancer. Safety Always wear your seat belt while driving or riding in a vehicle. Do not drive: If you have been drinking alcohol. Do not ride with someone who has been drinking. When you are tired or distracted. While texting. If you have been using any mind-altering substances or drugs. Wear a helmet and other protective equipment during sports activities. If you have firearms in your house, make sure you follow all gun safety procedures. Seek help if you have been physically or sexually abused. What's next? Visit your health care provider once a year for an annual wellness visit. Ask your health care provider how often you should have your eyes and teeth checked. Stay up to date on all vaccines. This information is not intended to replace advice given to you by your health care provider. Make sure you discuss any questions you have with your health care provider. Document Revised: 08/11/2020 Document Reviewed: 08/11/2020 Elsevier Patient Education  2024 Elsevier Inc. Breast Self-Awareness Breast self-awareness is knowing how your breasts look and feel. You need to: Check your breasts on a regular basis. Tell your doctor about any changes. Become familiar with the look and feel of your breasts. This can help you catch a breast problem while it is still small and can be treated. You should do breast self-exams even if you have breast implants. What you need: A mirror. A well-lit room. A pillow or other  soft object. How to do a breast self-exam Follow these steps to do a breast self-exam: Look for changes  Take off all the clothes above your waist. Stand in front of a mirror in a room with good lighting. Put your hands down at your sides. Compare your breasts in the mirror. Look for any difference between them, such as: A difference in shape. A difference in size. Wrinkles, dips, and bumps in one breast and not the other. Look at each breast for changes in the skin, such as: Redness. Scaly areas. Skin that has gotten thicker. Dimpling. Open sores (ulcers). Look for changes in your nipples, such as: Fluid coming out of a nipple. Fluid around a nipple. Bleeding. Dimpling. Redness. A nipple that looks pushed in (retracted), or that has changed position. Feel for changes Lie on your back. Feel each breast. To do this: Pick a breast to feel. Place a pillow under the shoulder closest to that breast. Put the arm closest to that breast behind your head. Feel the nipple area of that breast using the hand of your other arm. Feel the area with the pads of your three middle fingers by making small circles with your fingers. Use light, medium, and firm pressure. Continue the overlapping circles, moving downward over the breast. Keep making circles with your fingers. Stop when you feel your ribs. Start making circles with your fingers again, this time going   upward until you reach your collarbone. Then, make circles outward across your breast and into your armpit area. Squeeze your nipple. Check for discharge and lumps. Repeat these steps to check your other breast. Sit or stand in the tub or shower. With soapy water on your skin, feel each breast the same way you did when you were lying down. Write down what you find Writing down what you find can help you remember what to tell your doctor. Write down: What is normal for each breast. Any changes you find in each breast. These  include: The kind of changes you find. A tender or painful breast. Any lump you find. Write down its size and where it is. When you last had your monthly period (menstrual cycle). General tips If you are breastfeeding, the best time to check your breasts is after you feed your baby or after you use a breast pump. If you get monthly bleeding, the best time to check your breasts is 5-7 days after your monthly cycle ends. With time, you will become comfortable with the self-exam. You will also start to know if there are changes in your breasts. Contact a doctor if: You see a change in the shape or size of your breasts or nipples. You see a change in the skin of your breast or nipples, such as red or scaly skin. You have fluid coming from your nipples that is not normal. You find a new lump or thick area. You have breast pain. You have any concerns about your breast health. Summary Breast self-awareness includes looking for changes in your breasts and feeling for changes within your breasts. You should do breast self-awareness in front of a mirror in a well-lit room. If you get monthly periods (menstrual cycles), the best time to check your breasts is 5-7 days after your period ends. Tell your doctor about any changes you see in your breasts. Changes include changes in size, changes on the skin, painful or tender breasts, or fluid from your nipples that is not normal. This information is not intended to replace advice given to you by your health care provider. Make sure you discuss any questions you have with your health care provider. Document Revised: 07/21/2021 Document Reviewed: 12/16/2020 Elsevier Patient Education  2024 Elsevier Inc.  

## 2022-08-14 LAB — CYTOLOGY - PAP
Comment: NEGATIVE
Diagnosis: NEGATIVE
High risk HPV: NEGATIVE

## 2023-06-18 ENCOUNTER — Other Ambulatory Visit: Payer: Self-pay | Admitting: Internal Medicine

## 2023-06-18 DIAGNOSIS — Z1231 Encounter for screening mammogram for malignant neoplasm of breast: Secondary | ICD-10-CM

## 2023-08-02 ENCOUNTER — Ambulatory Visit
Admission: RE | Admit: 2023-08-02 | Discharge: 2023-08-02 | Disposition: A | Discharge status: Home / Self Care | Payer: Self-pay | Source: Ambulatory Visit | Attending: Internal Medicine | Admitting: Internal Medicine

## 2023-08-02 DIAGNOSIS — Z1231 Encounter for screening mammogram for malignant neoplasm of breast: Secondary | ICD-10-CM | POA: Diagnosis present

## 2023-08-14 NOTE — Progress Notes (Addendum)
 PCP: Cleotilde Oneil FALCON, MD   Chief Complaint  Patient presents with   Gynecologic Exam    No concerns    HPI:      [name] is a 63 y.o. [id number] whose LMP was No LMP recorded. Patient is postmenopausal., presents today for her annual examination.  Her menses are absent due to menopause, no PMB. She does not have vasomotor sx.   Sex activity: single partner, contraception - post menopausal status. She does have vaginal dryness and discomfort with sex, not improved with KY jelly. Dr. Connell told her she had vaginal atrophy but pt is not doing vag ERT. Did pelvic PT in past, still doing kegels.   Last Pap: 08/09/22 Results were: NILM/neg HPV DNA.   Last mammogram: 08/02/23 Results were: normal--routine follow-up in 12 months There is no FH of breast cancer. There is no FH of ovarian cancer. The patient does do self-breast exams.  Colonoscopy: 09/2013,  Repeat due after 10 years. Getting scheduled through PCP. DEXA: 10/21 and 6/24 with PCP with osteoporosis in your hip; hesitant about Rx meds. Doing ca/Vit D supp and exercise daily.  Tobacco use: The patient denies current or previous tobacco use. Alcohol use: social drinker No drug use Exercise: very active  She does get adequate calcium and Vitamin D  in her diet.  Labs with PCP.   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   Seborrheic keratosis 01/13/2016   Cherry angioma 01/13/2016   Preventative health care 11/12/2014   Migraine    Osteoporosis    Benign tumor of carotid body     Past Surgical History:  Procedure Laterality Date   CAROTID BODY TUMOR EXCISION     benign   COLONOSCOPY  10/06/2013    Family History  Problem Relation Age of Onset   Migraines Mother    Heart attack Father    Cancer Father        skin   Hypertension Father    Breast cancer Neg Hx     Social History   Socioeconomic  History   Marital status: Married    Spouse name: Charlena   Number of children: 1   Years of education: 12   Highest education level: Bachelor's degree (e.g., BA, AB, BS)  Occupational History   Not on file  Tobacco Use   Smoking status: Never   Smokeless tobacco: Never  Vaping Use   Vaping status: Never Used  Substance and Sexual Activity   Alcohol use: Yes    Comment: occassional   Drug use: No   Sexual activity: Yes    Birth control/protection: Post-menopausal  Other Topics Concern   Not on file  Social History Narrative   Not on file   Social Drivers of Health   Financial Resource Strain: Low Risk  (01/26/2020)   Overall Financial Resource Strain (CARDIA)    Difficulty of Paying Living Expenses: Not hard at all  Food Insecurity: No Food Insecurity (01/26/2020)   Hunger Vital Sign    Worried About Running Out of Food in the Last Year: Never true    Ran Out of Food in the Last Year: Never true  Transportation Needs: No Transportation Needs (01/26/2020)   PRAPARE - Administrator, Civil Service (Medical): No    Lack of Transportation (Non-Medical): No  Physical Activity: Sufficiently Active (01/26/2020)   Exercise Vital  Sign    Days of Exercise per Week: 5 days    Minutes of Exercise per Session: 60 min  Stress: No Stress Concern Present (01/26/2020)   [employer] of Occupational Health - Occupational Stress Questionnaire    Feeling of Stress : Not at all  Social Connections: Socially Integrated (01/26/2020)   Social Connection and Isolation Panel    Frequency of Communication with Friends and Family: More than three times a week    Frequency of Social Gatherings with Friends and Family: More than three times a week    Attends Religious Services: 1 to 4 times per year    Active Member of [employer] or Organizations: Yes    Attends Banker Meetings: 1 to 4 times per year    Marital Status: Married  Catering manager Violence: Not At Risk  (01/26/2020)   Humiliation, Afraid, Rape, and Kick questionnaire    Fear of Current or Ex-Partner: No    Emotionally Abused: No    Physically Abused: No    Sexually Abused: No     Current Outpatient Medications:    aspirin EC 81 MG tablet, Take 1 tablet (81 mg total) by mouth daily., Disp: , Rfl:    Calcium-Vitamin D -Vitamin K (CALCIUM SOFT CHEWS) [phone] MG-UNT-MCG CHEW, , Disp: , Rfl:    estradiol  (ESTRACE  VAGINAL) 0.1 MG/GM vaginal cream, Insert 1 g vaginally nightly for 1 week, then once wkly as maintenance, Disp: 42.5 g, Rfl: 0   rizatriptan (MAXALT-MLT) 10 MG disintegrating tablet, Take by mouth., Disp: , Rfl:      ROS:  Review of Systems  Constitutional:  Negative for fatigue, fever and unexpected weight change.  Respiratory:  Negative for cough, shortness of breath and wheezing.   Cardiovascular:  Negative for chest pain, palpitations and leg swelling.  Gastrointestinal:  Negative for blood in stool, constipation, diarrhea, nausea and vomiting.  Endocrine: Negative for cold intolerance, heat intolerance and polyuria.  Genitourinary:  Positive for dyspareunia. Negative for dysuria, flank pain, frequency, genital sores, hematuria, menstrual problem, pelvic pain, urgency, vaginal bleeding, vaginal discharge and vaginal pain.  Musculoskeletal:  Negative for back pain, joint swelling and myalgias.  Skin:  Negative for rash.  Neurological:  Negative for dizziness, syncope, light-headedness, numbness and headaches.  Hematological:  Negative for adenopathy.  Psychiatric/Behavioral:  Negative for agitation, confusion, sleep disturbance and suicidal ideas. The patient is not nervous/anxious.    BREAST: No symptoms    Objective: BP 132/75   Pulse 74   Wt 127 lb (57.6 kg)   BMI 20.19 kg/m    Physical Exam Constitutional:      Appearance: She is well-developed.  Genitourinary:     Vulva normal.     Right Labia: skin changes.     Right Labia: No rash, tenderness or  lesions.    Left Labia: skin changes.     Left Labia: No tenderness, lesions or rash.       No vaginal discharge, erythema or tenderness.     Anterior vaginal prolapse present.    Moderate vaginal atrophy present.     Right Adnexa: not tender and no mass present.    Left Adnexa: not tender and no mass present.    No cervical friability or polyp.     Uterus is not enlarged or tender.  Breasts:    Right: No mass, nipple discharge, skin change or tenderness.     Left: No mass, nipple discharge, skin change or tenderness.  Neck:  Thyroid: No thyromegaly.   Cardiovascular:     Rate and Rhythm: Normal rate and regular rhythm.     Heart sounds: Normal heart sounds. No murmur heard. Pulmonary:     Effort: Pulmonary effort is normal.     Breath sounds: Normal breath sounds.  Abdominal:     Palpations: Abdomen is soft.     Tenderness: There is no abdominal tenderness. There is no guarding or rebound.   Musculoskeletal:        General: Normal range of motion.     Cervical back: Normal range of motion.  Lymphadenopathy:     Cervical: No cervical adenopathy.   Neurological:     General: No focal deficit present.     Mental Status: She is alert and oriented to person, place, and time.     Cranial Nerves: No cranial nerve deficit.   Skin:    General: Skin is warm and dry.   Psychiatric:        Mood and Affect: Mood normal.        Behavior: Behavior normal.        Thought Content: Thought content normal.        Judgment: Judgment normal.  Vitals reviewed.     Assessment/Plan:  Encounter for annual routine gynecological examination  Encounter for screening mammogram for malignant neoplasm of breast; pt current on mammo  Vaginal atrophy - Plan: estradiol  (ESTRACE  VAGINAL) 0.1 MG/GM vaginal cream; try vag ERT internally for 2 wks as well as externally at bedtime for 2 wks, then RTO f/u f/u. Question atrophy vs LS.    Meds ordered this encounter  Medications   estradiol   (ESTRACE  VAGINAL) 0.1 MG/GM vaginal cream    Sig: Insert 1 g vaginally nightly for 1 week, then once wkly as maintenance    Dispense:  42.5 g    Refill:  0    Supervising Provider:   LEIGH SOBER [[id number]]            GYN counsel breast self exam, mammography screening, menopause, adequate intake of calcium and vitamin D , diet and exercise    F/U  Return in about 2 weeks (around 08/30/2023) for vaginitis f/u.  Avishai Reihl B. Vasili Fok, PA-C 08/16/2023 11:15 AM

## 2023-08-16 ENCOUNTER — Ambulatory Visit (INDEPENDENT_AMBULATORY_CARE_PROVIDER_SITE_OTHER): Payer: Self-pay | Admitting: Obstetrics and Gynecology

## 2023-08-16 ENCOUNTER — Encounter: Payer: Self-pay | Admitting: Obstetrics and Gynecology

## 2023-08-16 VITALS — BP 132/75 | HR 74 | Wt 127.0 lb

## 2023-08-16 DIAGNOSIS — Z1231 Encounter for screening mammogram for malignant neoplasm of breast: Secondary | ICD-10-CM

## 2023-08-16 DIAGNOSIS — Z01419 Encounter for gynecological examination (general) (routine) without abnormal findings: Secondary | ICD-10-CM

## 2023-08-16 DIAGNOSIS — Z1211 Encounter for screening for malignant neoplasm of colon: Secondary | ICD-10-CM

## 2023-08-16 DIAGNOSIS — N952 Postmenopausal atrophic vaginitis: Secondary | ICD-10-CM

## 2023-08-16 DIAGNOSIS — M810 Age-related osteoporosis without current pathological fracture: Secondary | ICD-10-CM

## 2023-08-16 MED ORDER — ESTRADIOL 0.1 MG/GM VA CREA: TOPICAL_CREAM | VAGINAL | 0 refills | Status: AC

## 2023-08-16 NOTE — Patient Instructions (Signed)
 I value your feedback and you entrusting [place] with your care. If you get a [facility] [place] [facility] patient survey, I would appreciate you taking the time to let us know about your experience today. Thank you! ? ? ?

## 2023-08-27 NOTE — Progress Notes (Unsigned)
 Cleotilde Oneil FALCON, MD   No chief complaint on file.   HPI:      [name] is a 63 y.o. [id number] whose LMP was No LMP recorded. Patient is postmenopausal., presents today for vaginal atrophy f/u. Treating with vag ERT for 2 wks, then f/u  Vaginal atrophy - Plan: estradiol  (ESTRACE  VAGINAL) 0.1 MG/GM vaginal cream; try vag ERT internally for 2 wks as well as externally at bedtime for 2 wks, then RTO f/u f/u. Question atrophy vs LS.   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   Seborrheic keratosis 01/13/2016   Cherry angioma 01/13/2016   Preventative health care 11/12/2014   Migraine    Osteoporosis    Benign tumor of carotid body     Past Surgical History:  Procedure Laterality Date   CAROTID BODY TUMOR EXCISION     benign   COLONOSCOPY  10/06/2013    Family History  Problem Relation Age of Onset   Migraines Mother    Heart attack Father    Cancer Father        skin   Hypertension Father    Breast cancer Neg Hx     Social History   Socioeconomic History   Marital status: Married    Spouse name: Charlena   Number of children: 1   Years of education: 12   Highest education level: Bachelor's degree (e.g., BA, AB, BS)  Occupational History   Not on file  Tobacco Use   Smoking status: Never   Smokeless tobacco: Never  Vaping Use   Vaping status: Never Used  Substance and Sexual Activity   Alcohol use: Yes    Comment: occassional   Drug use: No   Sexual activity: Yes    Birth control/protection: Post-menopausal  Other Topics Concern   Not on file  Social History Narrative   Not on file   Social Drivers of Health   Financial Resource Strain: Low Risk  (01/26/2020)   Overall Financial Resource Strain (CARDIA)    Difficulty of Paying Living Expenses: Not hard at all  Food Insecurity: No Food Insecurity (01/26/2020)   Hunger Vital Sign    Worried  About Running Out of Food in the Last Year: Never true    Ran Out of Food in the Last Year: Never true  Transportation Needs: No Transportation Needs (01/26/2020)   PRAPARE - Administrator, Civil Service (Medical): No    Lack of Transportation (Non-Medical): No  Physical Activity: Sufficiently Active (01/26/2020)   Exercise Vital Sign    Days of Exercise per Week: 5 days    Minutes of Exercise per Session: 60 min  Stress: No Stress Concern Present (01/26/2020)   [employer] of Occupational Health - Occupational Stress Questionnaire    Feeling of Stress : Not at all  Social Connections: Socially Integrated (01/26/2020)   Social Connection and Isolation Panel    Frequency of Communication with Friends and Family: More than three times a week    Frequency of Social Gatherings with Friends and Family: More than three times a week    Attends Religious Services: 1 to 4 times per year    Active Member of [employer] or Organizations: Yes    Attends Banker Meetings: 1 to 4 times per year    Marital Status: Married  Catering manager Violence: Not At Risk (  01/26/2020)   Humiliation, Afraid, Rape, and Kick questionnaire    Fear of Current or Ex-Partner: No    Emotionally Abused: No    Physically Abused: No    Sexually Abused: No    Outpatient Medications Prior to Visit  Medication Sig Dispense Refill   aspirin EC 81 MG tablet Take 1 tablet (81 mg total) by mouth daily.     Calcium-Vitamin D -Vitamin K (CALCIUM SOFT CHEWS) [phone] MG-UNT-MCG CHEW      estradiol  (ESTRACE  VAGINAL) 0.1 MG/GM vaginal cream Insert 1 g vaginally nightly for 1 week, then once wkly as maintenance 42.5 g 0   rizatriptan (MAXALT-MLT) 10 MG disintegrating tablet Take by mouth.     No facility-administered medications prior to visit.      ROS:  Review of Systems BREAST: No symptoms   OBJECTIVE:   Vitals:  There were no vitals taken for this visit.  Physical  Exam  Results: No results found for this or any previous visit (from the past 24 hours).   Assessment/Plan: No diagnosis found.    No orders of the defined types were placed in this encounter.     No follow-ups on file.  Perrin Eddleman B. Anshul Meddings, PA-C 08/27/2023 3:13 PM

## 2023-08-28 ENCOUNTER — Ambulatory Visit: Admitting: Obstetrics and Gynecology

## 2023-08-28 ENCOUNTER — Encounter: Payer: Self-pay | Admitting: Obstetrics and Gynecology

## 2023-08-28 VITALS — BP 122/70 | HR 80 | Ht 66.5 in | Wt 128.0 lb

## 2023-08-28 DIAGNOSIS — N952 Postmenopausal atrophic vaginitis: Secondary | ICD-10-CM | POA: Diagnosis not present

## 2023-08-28 NOTE — Patient Instructions (Signed)
 I value your feedback and you entrusting [place] with your care. If you get a [facility] [place] [facility] patient survey, I would appreciate you taking the time to let us know about your experience today. Thank you! ? ? ?

## 2024-01-14 ENCOUNTER — Ambulatory Visit: Admitting: Certified Registered"

## 2024-01-14 ENCOUNTER — Encounter: Payer: Self-pay | Admitting: Gastroenterology

## 2024-01-14 ENCOUNTER — Encounter
Admission: RE | Disposition: A | Discharge status: Home / Self Care | Payer: Self-pay | Source: Home / Self Care | Attending: Gastroenterology

## 2024-01-14 ENCOUNTER — Ambulatory Visit
Admission: RE | Admit: 2024-01-14 | Discharge: 2024-01-14 | Disposition: A | Discharge status: Home / Self Care | Attending: Gastroenterology | Admitting: Gastroenterology

## 2024-01-14 ENCOUNTER — Other Ambulatory Visit: Payer: Self-pay | Admitting: Gastroenterology

## 2024-01-14 DIAGNOSIS — Z1211 Encounter for screening for malignant neoplasm of colon: Secondary | ICD-10-CM | POA: Insufficient documentation

## 2024-01-14 DIAGNOSIS — K5730 Diverticulosis of large intestine without perforation or abscess without bleeding: Secondary | ICD-10-CM | POA: Insufficient documentation

## 2024-01-14 DIAGNOSIS — D122 Benign neoplasm of ascending colon: Secondary | ICD-10-CM | POA: Diagnosis not present

## 2024-01-14 HISTORY — PX: COLONOSCOPY: SHX5424

## 2024-01-14 HISTORY — PX: POLYPECTOMY: SHX149

## 2024-01-14 SURGERY — COLONOSCOPY
Anesthesia: General

## 2024-01-14 MED ORDER — PROPOFOL 500 MG/50ML IV EMUL
INTRAVENOUS | Status: DC | PRN
  Administered 2024-01-14: 120 ug/kg/min via INTRAVENOUS

## 2024-01-14 MED ORDER — LIDOCAINE HCL (CARDIAC) PF 100 MG/5ML IV SOSY
PREFILLED_SYRINGE | INTRAVENOUS | Status: DC | PRN
  Administered 2024-01-14: 50 mg via INTRAVENOUS

## 2024-01-14 MED ORDER — SODIUM CHLORIDE 0.9 % IV SOLN
INTRAVENOUS | Status: DC
  Administered 2024-01-14: 20 mL/h via INTRAVENOUS

## 2024-01-14 MED ORDER — PROPOFOL 10 MG/ML IV BOLUS
INTRAVENOUS | Status: DC | PRN
Start: 2024-01-14 — End: 2024-01-14
  Administered 2024-01-14: 50 mg via INTRAVENOUS

## 2024-01-14 NOTE — H&P (Signed)
 Ruel Kung , MD [address], [address], [place], [place], [address] Phone: [phone] Fax: [phone]  Primary Care Physician:  Cleotilde Oneil FALCON, MD   Pre-Procedure History & Physical: HPI:  Caroline Harmon is a 63 y.o. female is here for an colonoscopy.   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 Authorizing Provider  aspirin EC 81 MG tablet Take 1 tablet (81 mg total) by mouth daily. 12/26/16   Hinton Newell SQUIBB, MD  Calcium-Vitamin D -Vitamin K (CALCIUM SOFT CHEWS) [phone] MG-UNT-MCG CHEW  11/07/19   [provider]  estradiol  (ESTRACE  VAGINAL) 0.1 MG/GM vaginal cream Insert 1 g vaginally nightly for 1 week, then once wkly as maintenance 08/16/23   Copland, Alicia B, PA-C  rizatriptan (MAXALT-MLT) 10 MG disintegrating tablet Take by mouth.    [provider]    Allergies as of 12/25/2023 - Review Complete 08/28/2023  Allergen Reaction Noted   Topamax [topiramate] Other (See Comments) 10/02/2014    Family History  Problem Relation Age of Onset   Migraines Mother    Heart attack Father    Cancer Father        skin   Hypertension Father    Breast cancer Neg Hx     Social History   Socioeconomic History   Marital status: Married    Spouse name: Tom   Number of children: 1   Years of education: 12   Highest education level: Bachelor's degree (e.g., BA, AB, BS)  Occupational History   Not on file  Tobacco Use   Smoking status: Never   Smokeless tobacco: Never  Vaping Use   Vaping status: Never Used  Substance and Sexual Activity    Alcohol use: Yes    Comment: occassional   Drug use: No   Sexual activity: Yes    Birth control/protection: Post-menopausal  Other Topics Concern   Not on file  Social History Narrative   Not on file   Social Drivers of Health   Financial Resource Strain: Low Risk  (09/20/2023)   Received from [facility]   Overall Financial Resource Strain (CARDIA)    Difficulty of Paying Living Expenses: Not hard at all  Food Insecurity: No Food Insecurity (09/20/2023)   Received from [facility]   Hunger Vital Sign    Within the past 12 months, you worried that your food would run out before you got the money to buy more.: Never true    Within the past 12 months, the food you bought just didn't last and you didn't have money to get more.: Never true  Transportation Needs: No Transportation Needs (09/20/2023)   Received from [facility] - Transportation    In the past 12 months, has lack of transportation kept you from medical appointments or from getting medications?: No    Lack of Transportation (Non-Medical): No  Physical Activity: Sufficiently Active (01/26/2020)   Exercise Vital Sign    Days of Exercise per Week: 5 days    Minutes of Exercise per Session: 60 min  Stress: No Stress Concern Present (01/26/2020)  [employer] of [employer]ational [employer] - Occupational Stress Questionnaire    Feeling of Stress : Not at all  Social Connections: Socially Integrated (01/26/2020)   Social Connection and Isolation Panel    Frequency of Communication with Friends and Family: More than three times a week    Frequency of Social Gatherings with Friends and Family: More than three times a week    Attends Religious Services: 1 to 4 times per year    Active Member of [employer] or Organizations: Yes    Attends Banker Meetings: 1 to 4 times per year    Marital Status: Married  Catering Manager Violence: Not At Risk (01/26/2020)    Humiliation, Afraid, Rape, and Kick questionnaire    Fear of Current or Ex-Partner: No    Emotionally Abused: No    Physically Abused: No    Sexually Abused: No    Review of Systems: See HPI, otherwise negative ROS  Physical Exam: There were no vitals taken for this visit. General:   Alert,  pleasant and cooperative in NAD Head:  Normocephalic and atraumatic. Neck:  Supple; no masses or thyromegaly. Lungs:  Clear throughout to auscultation, normal respiratory effort.    Heart:  +S1, +S2, Regular rate and rhythm, No edema. Abdomen:  Soft, nontender and nondistended. Normal bowel sounds, without guarding, and without rebound.   Neurologic:  Alert and  oriented x4;  grossly normal neurologically.  Impression/Plan: Caroline Harmon is here for an colonoscopy to be performed for Screening colonoscopy average risk   Risks, benefits, limitations, and alternatives regarding  colonoscopy have been reviewed with the patient.  Questions have been answered.  All parties agreeable.   Ruel Kung, MD  01/14/2024, 9:32 AM

## 2024-01-14 NOTE — Transfer of Care (Signed)
 Immediate Anesthesia Transfer of Care Note  Patient: Caroline Harmon  Procedure(s) Performed: COLONOSCOPY POLYPECTOMY, INTESTINE  Patient Location: Endoscopy Unit  Anesthesia Type:General  Level of Consciousness: drowsy  Airway & Oxygen Therapy: Patient Spontanous Breathing  Post-op Assessment: Report given to RN  Post vital signs: stable  Last Vitals:  Vitals Value Taken Time  BP    Temp    Pulse    Resp    SpO2      Last Pain:  Vitals:   01/14/24 0945  TempSrc: Temporal  PainSc: 0-No pain         Complications: No notable events documented.

## 2024-01-14 NOTE — Anesthesia Preprocedure Evaluation (Signed)
 Anesthesia Evaluation  Patient identified by MRN, date of birth, ID band Patient awake    Reviewed: Allergy & Precautions, NPO status , Patient's Chart, lab work & pertinent test results  History of Anesthesia Complications Negative for: history of anesthetic complications  Airway Mallampati: III  TM Distance: >3 FB Neck ROM: full    Dental no notable dental hx.    Pulmonary neg pulmonary ROS   Pulmonary exam normal        Cardiovascular negative cardio ROS Normal cardiovascular exam     Neuro/Psych  Headaches  negative psych ROS   GI/Hepatic negative GI ROS, Neg liver ROS,,,  Endo/Other  negative endocrine ROS    Renal/GU negative Renal ROS  negative genitourinary   Musculoskeletal   Abdominal   Peds  Hematology negative hematology ROS (+)   Anesthesia Other Findings Past Medical History: No date: Benign tumor of carotid body     Comment:  carotid gland tumor removed No date: Migraine No date: Osteoporosis  Past Surgical History: No date: CAROTID BODY TUMOR EXCISION     Comment:  benign 10/06/2013: COLONOSCOPY     Reproductive/Obstetrics negative OB ROS                              Anesthesia Physical Anesthesia Plan  ASA: 2  Anesthesia Plan: General   Post-op Pain Management: Minimal or no pain anticipated   Induction: Intravenous  PONV Risk Score and Plan: 2 and Propofol infusion and TIVA  Airway Management Planned: Natural Airway and Nasal Cannula  Additional Equipment:   Intra-op Plan:   Post-operative Plan:   Informed Consent: I have reviewed the patients History and Physical, chart, labs and discussed the procedure including the risks, benefits and alternatives for the proposed anesthesia with the patient or authorized representative who has indicated his/her understanding and acceptance.     Dental Advisory Given  Plan Discussed with:  Anesthesiologist, CRNA and Surgeon  Anesthesia Plan Comments: (Patient consented for risks of anesthesia including but not limited to:  - adverse reactions to medications - risk of airway placement if required - damage to eyes, teeth, lips or other oral mucosa - nerve damage due to positioning  - sore throat or hoarseness - Damage to heart, brain, nerves, lungs, other parts of body or loss of life  Patient voiced understanding and assent.)        Anesthesia Quick Evaluation

## 2024-01-14 NOTE — Op Note (Signed)
 [facility] Patient Name: Caroline Harmon Procedure Date: 01/14/2024 10:16 AM MRN: [id number] Account #: [id number] Date of Birth: 08-19-1960 Admit Type: Outpatient Age: 63 Room: [facility] ENDO ROOM 1 Gender: Female Note Status: Finalized Instrument Name: Peds Colonoscope [id number] Procedure:             Colonoscopy Indications:           Screening for colorectal malignant neoplasm Providers:             Ruel Kung MD, MD Referring MD:          Oneil PHEBE Pinal, MD (Referring MD) Medicines:             Monitored Anesthesia Care Complications:         No immediate complications. Procedure:             Pre-Anesthesia Assessment:                        - Prior to the procedure, a History and Physical was                         performed, and patient medications, allergies and                         sensitivities were reviewed. The patient's tolerance                         of previous anesthesia was reviewed.                        - The risks and benefits of the procedure and the                         sedation options and risks were discussed with the                         patient. All questions were answered and informed                         consent was obtained.                        - ASA Grade Assessment: II - A patient with mild                         systemic disease.                        After obtaining informed consent, the colonoscope was                         passed under direct vision. Throughout the procedure,                         the patient's blood pressure, pulse, and oxygen                         saturations were monitored continuously. The                         Colonoscope  was introduced through the anus and                         advanced to the the cecum, identified by the                         appendiceal orifice. The colonoscopy was performed                         with ease. The patient tolerated the procedure  well.                         The quality of the bowel preparation was excellent.                         The ileocecal valve, appendiceal orifice, and rectum                         were photographed. Findings:      The perianal and digital rectal examinations were normal.      An 8 mm polyp was found in the proximal ascending colon. The polyp was       sessile. The polyp was removed with a cold snare. Resection and       retrieval were complete.      Multiple small-mouthed diverticula were found in the sigmoid colon.      The exam was otherwise without abnormality on direct and retroflexion       views. Impression:            - One 8 mm polyp in the proximal ascending colon,                         removed with a cold snare. Resected and retrieved.                        - Diverticulosis in the sigmoid colon.                        - The examination was otherwise normal on direct and                         retroflexion views. Recommendation:        - Discharge patient to home (with escort).                        - Resume previous diet.                        - Continue present medications.                        - Await pathology results.                        - Repeat colonoscopy for surveillance based on                         pathology results. Procedure Code(s):     --- Professional ---  54614, Colonoscopy, flexible; with removal of                         tumor(s), polyp(s), or other lesion(s) by snare                         technique Diagnosis Code(s):     --- Professional ---                        Z12.11, Encounter for screening for malignant neoplasm                         of colon                        D12.2, Benign neoplasm of ascending colon                        K57.30, Diverticulosis of large intestine without                         perforation or abscess without bleeding CPT copyright 2022 American Medical Association. All rights  reserved. The codes documented in this report are preliminary and upon coder review may  be revised to meet current compliance requirements. Ruel Kung, MD Ruel Kung MD, MD 01/14/2024 10:38:22 AM This report has been signed electronically. Number of Addenda: 0 Note Initiated On: 01/14/2024 10:16 AM Scope Withdrawal Time: 0 hours 6 minutes 54 seconds  Total Procedure Duration: 0 hours 11 minutes 55 seconds  Estimated Blood Loss:  Estimated blood loss: none.      [facility]

## 2024-01-15 LAB — SURGICAL PATHOLOGY

## 2024-01-15 NOTE — Anesthesia Postprocedure Evaluation (Signed)
 Anesthesia Post Note  Patient: Caroline Harmon  Procedure(s) Performed: COLONOSCOPY POLYPECTOMY, INTESTINE  Patient location during evaluation: Endoscopy Anesthesia Type: General Level of consciousness: awake and alert Pain management: pain level controlled Vital Signs Assessment: post-procedure vital signs reviewed and stable Respiratory status: spontaneous breathing, nonlabored ventilation, respiratory function stable and patient connected to nasal cannula oxygen Cardiovascular status: blood pressure returned to baseline and stable Postop Assessment: no apparent nausea or vomiting Anesthetic complications: no   No notable events documented.   Last Vitals:  Vitals:   01/14/24 1051 01/14/24 1100  BP: 106/62 107/68  Pulse: 75 74  Resp: 17 15  Temp:    SpO2: 100% 100%    Last Pain:  Vitals:   01/14/24 0945  TempSrc: Temporal  PainSc: 0-No pain                 Lendia LITTIE Mae
